# Patient Record
Sex: Male | Born: 1950
Health system: Southern US, Community
[De-identification: ages and names within clinical notes are randomized; demographics above are authoritative.]

## PROBLEM LIST (undated history)

## (undated) DIAGNOSIS — I340 Nonrheumatic mitral (valve) insufficiency: Secondary | ICD-10-CM

## (undated) DIAGNOSIS — I071 Rheumatic tricuspid insufficiency: Secondary | ICD-10-CM

## (undated) DIAGNOSIS — M109 Gout, unspecified: Secondary | ICD-10-CM

## (undated) DIAGNOSIS — Z973 Presence of spectacles and contact lenses: Secondary | ICD-10-CM

## (undated) DIAGNOSIS — E785 Hyperlipidemia, unspecified: Secondary | ICD-10-CM

## (undated) DIAGNOSIS — E119 Type 2 diabetes mellitus without complications: Secondary | ICD-10-CM

## (undated) DIAGNOSIS — M1990 Unspecified osteoarthritis, unspecified site: Secondary | ICD-10-CM

## (undated) DIAGNOSIS — I499 Cardiac arrhythmia, unspecified: Secondary | ICD-10-CM

## (undated) DIAGNOSIS — G4730 Sleep apnea, unspecified: Secondary | ICD-10-CM

## (undated) DIAGNOSIS — T7840XA Allergy, unspecified, initial encounter: Secondary | ICD-10-CM

## (undated) DIAGNOSIS — Z974 Presence of external hearing-aid: Secondary | ICD-10-CM

## (undated) DIAGNOSIS — I2510 Atherosclerotic heart disease of native coronary artery without angina pectoris: Secondary | ICD-10-CM

## (undated) DIAGNOSIS — Z87442 Personal history of urinary calculi: Secondary | ICD-10-CM

## (undated) DIAGNOSIS — I10 Essential (primary) hypertension: Secondary | ICD-10-CM

## (undated) HISTORY — PX: TONSILLECTOMY: SUR1361

## (undated) HISTORY — PX: ADENOIDECTOMY: SUR15

## (undated) HISTORY — PX: SHOULDER SURGERY: SHX246

## (undated) HISTORY — DX: Gout, unspecified: M10.9

## (undated) HISTORY — PX: APPENDECTOMY: SHX54

## (undated) HISTORY — DX: Type 2 diabetes mellitus without complications: E11.9

## (undated) HISTORY — DX: Allergy, unspecified, initial encounter: T78.40XA

## (undated) HISTORY — DX: Essential (primary) hypertension: I10

---

## 2004-01-16 ENCOUNTER — Other Ambulatory Visit: Payer: Self-pay

## 2004-01-22 ENCOUNTER — Ambulatory Visit: Payer: Self-pay | Admitting: Surgery

## 2004-11-20 ENCOUNTER — Ambulatory Visit: Payer: Self-pay | Admitting: Family Medicine

## 2006-02-26 ENCOUNTER — Ambulatory Visit: Payer: Self-pay | Admitting: Family Medicine

## 2006-10-14 ENCOUNTER — Ambulatory Visit: Payer: Self-pay | Admitting: Family Medicine

## 2007-02-21 ENCOUNTER — Ambulatory Visit: Payer: Self-pay | Admitting: Unknown Physician Specialty

## 2007-03-16 ENCOUNTER — Ambulatory Visit: Payer: Self-pay | Admitting: Unknown Physician Specialty

## 2007-04-05 ENCOUNTER — Ambulatory Visit: Payer: Self-pay | Admitting: Unknown Physician Specialty

## 2007-05-02 ENCOUNTER — Ambulatory Visit: Payer: Self-pay | Admitting: Internal Medicine

## 2008-04-10 ENCOUNTER — Ambulatory Visit: Payer: Self-pay | Admitting: Family Medicine

## 2008-05-22 ENCOUNTER — Ambulatory Visit: Payer: Self-pay | Admitting: Family Medicine

## 2009-03-27 ENCOUNTER — Ambulatory Visit: Payer: Self-pay | Admitting: Family Medicine

## 2009-12-05 ENCOUNTER — Ambulatory Visit: Payer: Self-pay | Admitting: Internal Medicine

## 2009-12-12 ENCOUNTER — Ambulatory Visit: Payer: Self-pay | Admitting: Internal Medicine

## 2009-12-18 ENCOUNTER — Ambulatory Visit: Payer: Self-pay | Admitting: Family Medicine

## 2010-01-04 ENCOUNTER — Ambulatory Visit: Payer: Self-pay | Admitting: Internal Medicine

## 2010-03-06 ENCOUNTER — Ambulatory Visit: Payer: Self-pay | Admitting: Internal Medicine

## 2010-04-06 ENCOUNTER — Ambulatory Visit: Payer: Self-pay | Admitting: Internal Medicine

## 2012-01-05 ENCOUNTER — Ambulatory Visit: Payer: Self-pay | Admitting: Orthopedic Surgery

## 2012-12-02 ENCOUNTER — Emergency Department: Payer: Self-pay | Admitting: Emergency Medicine

## 2012-12-02 LAB — CBC
HGB: 16.9 g/dL (ref 13.0–18.0)
MCH: 31.8 pg (ref 26.0–34.0)
MCHC: 35.9 g/dL (ref 32.0–36.0)
Platelet: 138 10*3/uL — ABNORMAL LOW (ref 150–440)
RBC: 5.31 10*6/uL (ref 4.40–5.90)
WBC: 10.1 10*3/uL (ref 3.8–10.6)

## 2012-12-02 LAB — URINALYSIS, COMPLETE
Glucose,UR: NEGATIVE mg/dL (ref 0–75)
Nitrite: NEGATIVE
RBC,UR: 9 /HPF (ref 0–5)
Specific Gravity: 1.02 (ref 1.003–1.030)
Squamous Epithelial: <1

## 2012-12-02 LAB — BASIC METABOLIC PANEL
Calcium, Total: 9.9 mg/dL (ref 8.5–10.1)
Chloride: 101 mmol/L (ref 98–107)
Co2: 29 mmol/L (ref 21–32)
EGFR (African American): >60
EGFR (Non-African Amer.): >60
Osmolality: 278 (ref 275–301)
Potassium: 4.4 mmol/L (ref 3.5–5.1)
Sodium: 137 mmol/L (ref 136–145)

## 2013-09-14 ENCOUNTER — Ambulatory Visit: Payer: Self-pay | Admitting: Orthopedic Surgery

## 2013-09-15 ENCOUNTER — Ambulatory Visit (INDEPENDENT_AMBULATORY_CARE_PROVIDER_SITE_OTHER): Payer: BC Managed Care – PPO | Admitting: Podiatry

## 2013-09-15 ENCOUNTER — Encounter: Payer: Self-pay | Admitting: Podiatry

## 2013-09-15 ENCOUNTER — Ambulatory Visit (INDEPENDENT_AMBULATORY_CARE_PROVIDER_SITE_OTHER): Payer: BC Managed Care – PPO

## 2013-09-15 VITALS — BP 133/79 | HR 67 | Resp 16 | Ht 69.0 in | Wt 230.0 lb

## 2013-09-15 DIAGNOSIS — M722 Plantar fascial fibromatosis: Secondary | ICD-10-CM

## 2013-09-15 DIAGNOSIS — I10 Essential (primary) hypertension: Secondary | ICD-10-CM | POA: Insufficient documentation

## 2013-09-15 DIAGNOSIS — G4730 Sleep apnea, unspecified: Secondary | ICD-10-CM | POA: Insufficient documentation

## 2013-09-15 DIAGNOSIS — E785 Hyperlipidemia, unspecified: Secondary | ICD-10-CM | POA: Insufficient documentation

## 2013-09-15 DIAGNOSIS — E119 Type 2 diabetes mellitus without complications: Secondary | ICD-10-CM | POA: Insufficient documentation

## 2013-09-15 MED ORDER — TRIAMCINOLONE ACETONIDE 10 MG/ML IJ SUSP
10.0000 mg | Freq: Once | INTRAMUSCULAR | Status: AC
  Administered 2013-09-15: 10 mg

## 2013-09-15 NOTE — Progress Notes (Signed)
Subjective:     Patient ID: Phillip Le, male   DOB: Apr 02, 1951, 63 y.o.   MRN: [id number]  HPI patient presents stating the arch of both feet are now hurting and the nodules have returned on both feet with them having disappeared after you gave me the injection last year and just reappeared and the last month. Also states he's taking a steroid pack because of hip issues and that seems to make the feet feel a little bit better   Review of Systems     Objective:   Physical Exam Neurovascular status intact with no health history changes noted and masses in the distal part of the plantar fascia both feet measure about 1 cm x 1 cm and are mildly tender when pressed.    Assessment:     Probable cyst formation as they did go away last time with injection    Plan:     Reviewed x-rays of both feet indicating no calcification and injected the plantar fascia distal of both feet 3 mg Kenalog 5 mm Xylocaine Marcaine mixture and instructed to come in if this does not improve condition or if the masses do not shrink

## 2013-09-15 NOTE — Progress Notes (Signed)
Both feet have a knot below the hallux . Its been about a month and dr Noemi Chapel put me on a prednisone that seems to be helping with the pain

## 2014-02-09 DIAGNOSIS — R002 Palpitations: Secondary | ICD-10-CM | POA: Insufficient documentation

## 2014-07-17 IMAGING — CT CT STONE STUDY
1 of 2 series · 15 of 32 positions shown, 19 images · non-contrast
Comparison: none

REASON FOR EXAM: flank pain
COMMENTS:

PROCEDURE:     CT  - CT ABDOMEN /PELVIS WO (STONE)  - December 02, 2012  [DATE]
RESULT:     CT abdomen pelvis dated 12/02/2012
TECHNIQUE: Helical noncontrasted 3 mm sections were obtained from the lung
bases through the pubic symphysis. Since compared to previous study dated
03/27/2009.

[Series 2: 3mm soft tissue · axial · 0.89mm/px · z∈[+64,+544]mm · 15 of 177 slices shown, 19 images]
[im 9/177  soft-tissue]
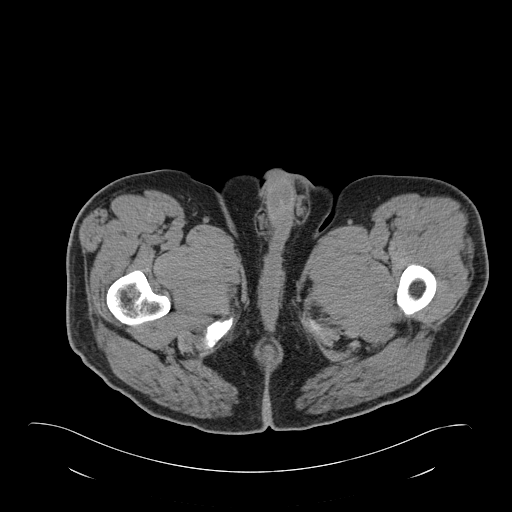
[im 9/177  bone]
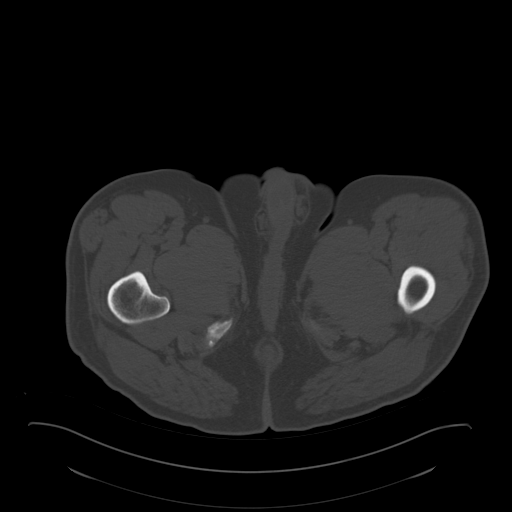
[im 25/177  soft-tissue]
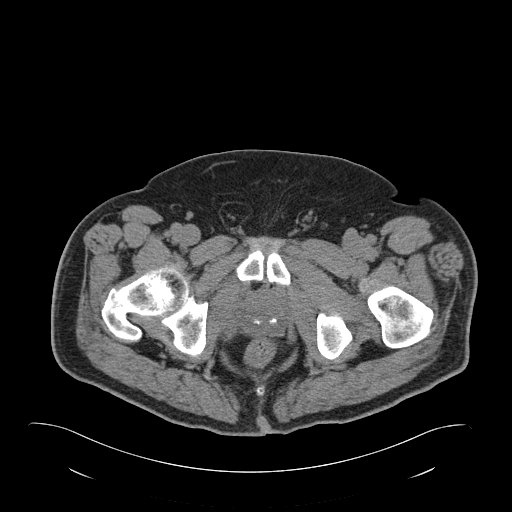
[im 41/177  soft-tissue]
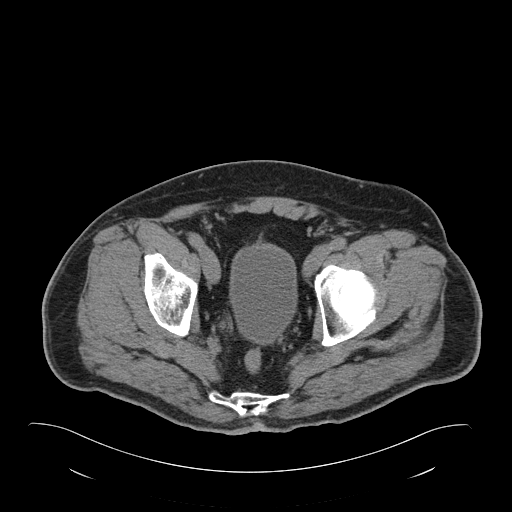
[im 49/177  soft-tissue]
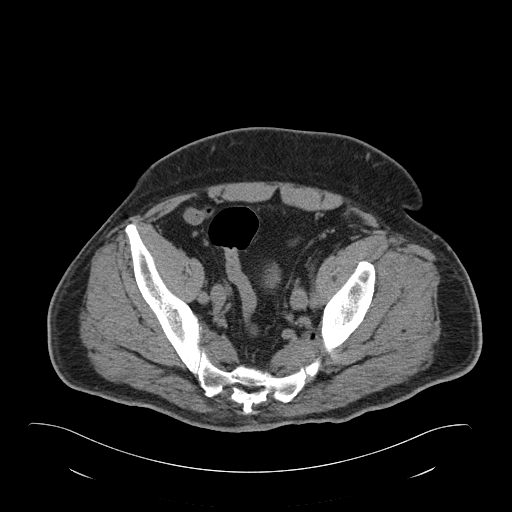
[im 65/177  soft-tissue]
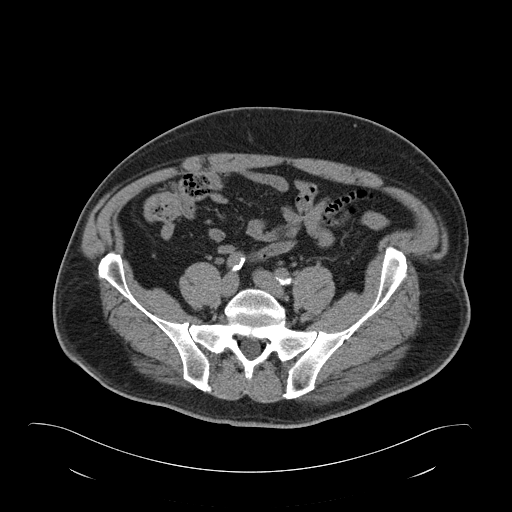
[im 73/177  soft-tissue]
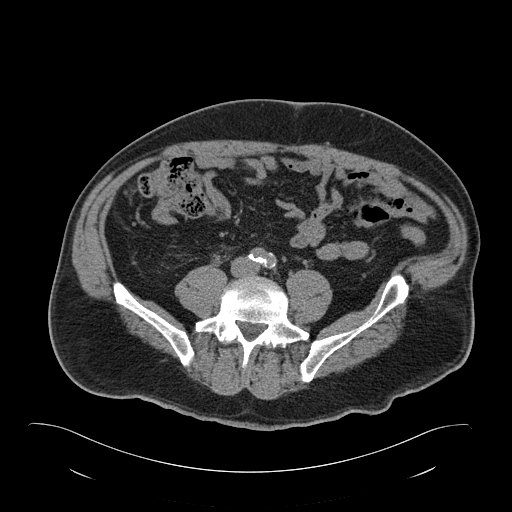
[im 89/177  soft-tissue]
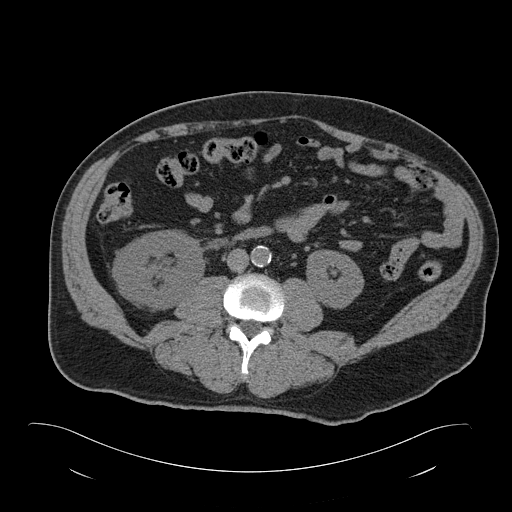
[im 105/177  soft-tissue]
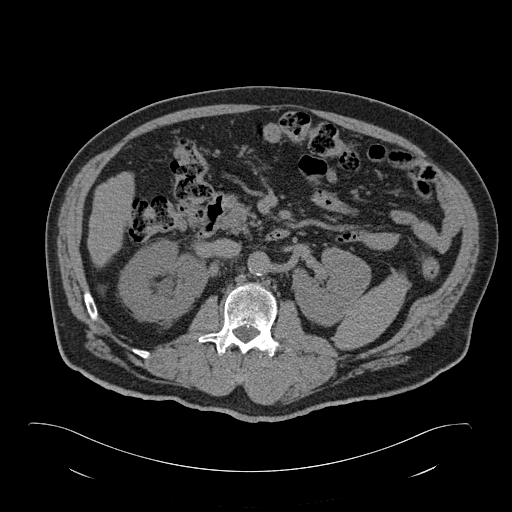
[im 113/177  soft-tissue]
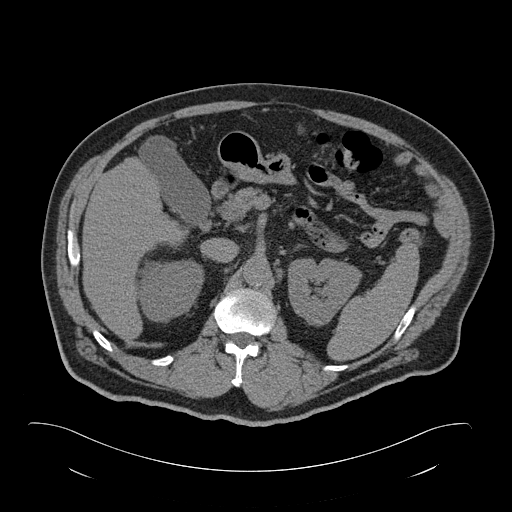
[im 113/177  bone]
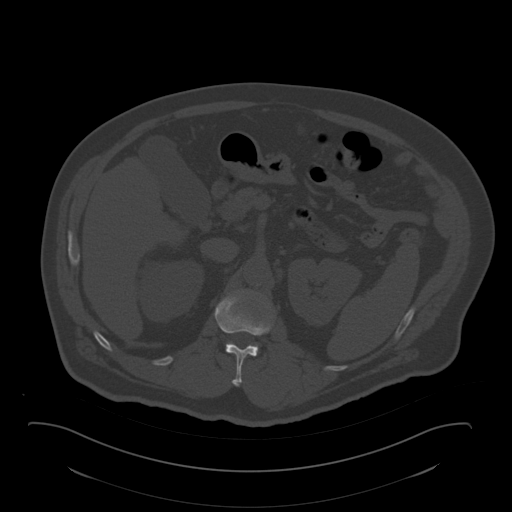
[im 129/177  soft-tissue]
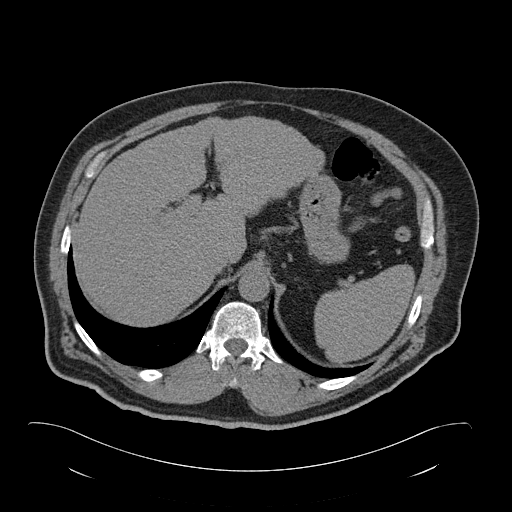
[im 137/177  soft-tissue]
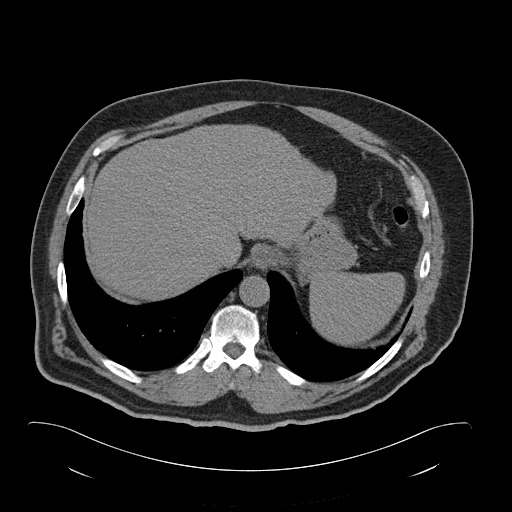
[im 145/177  lung]
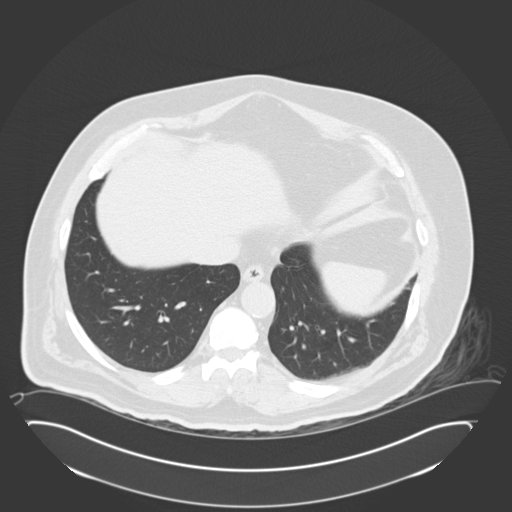
[im 153/177  soft-tissue]
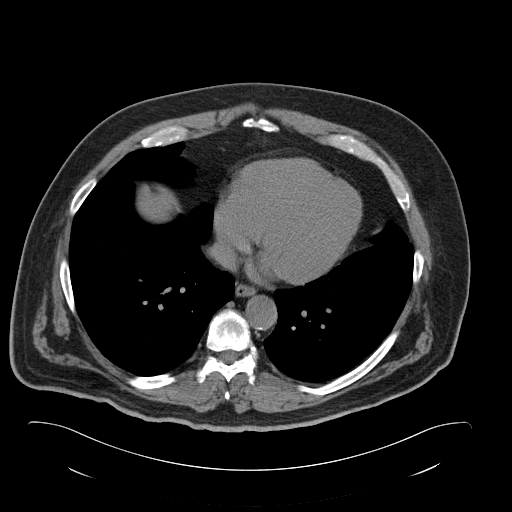
[im 153/177  lung]
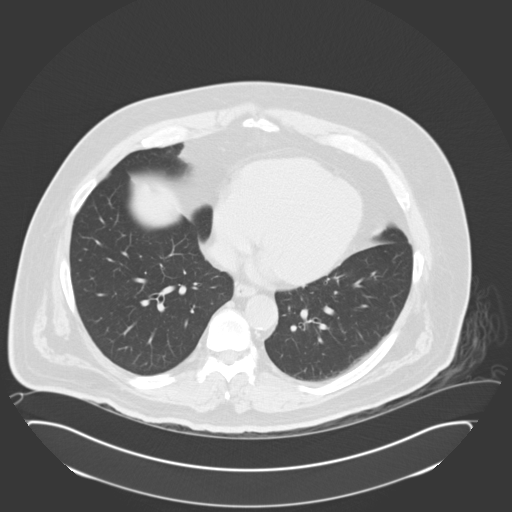
[im 161/177  lung]
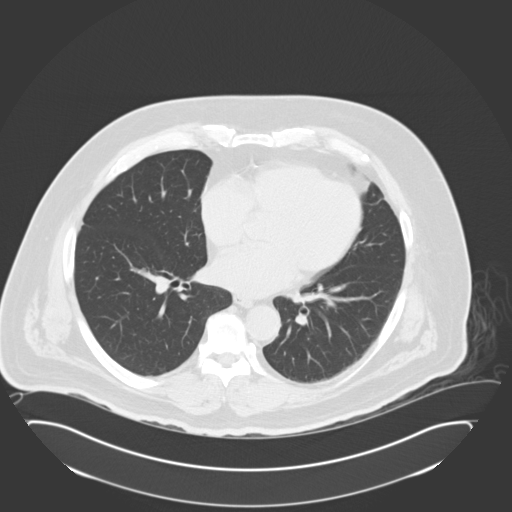
[im 169/177  soft-tissue]
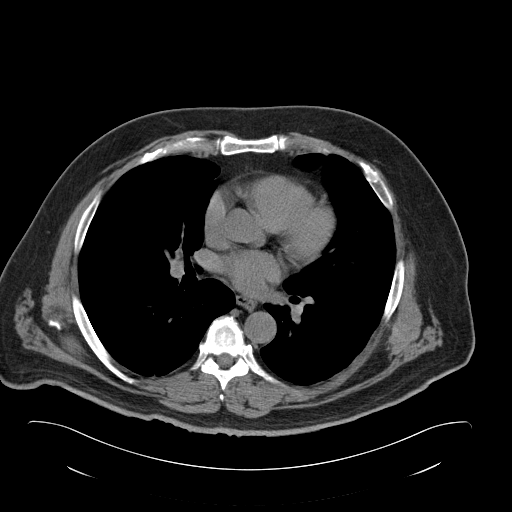
[im 169/177  lung]
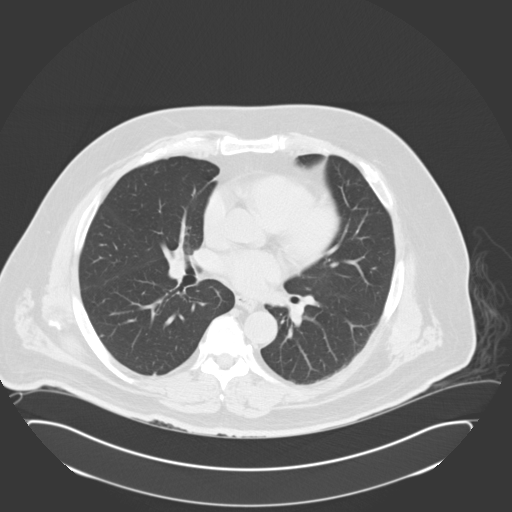

[15 of 32 positions shown; findings below may reference images not displayed]

FINDINGS: The lung bases are unremarkable.

Noncontrast evaluation of the liver, spleen, adrenals, pancreas, left kidney
are unremarkable.

Right kidney demonstrates mild hydronephrosis and mild hydroureter. Mild
stranding within the perinephric and periureteral fat is appreciated. Within
the distal right ureter at the level of the ureteral vesicle junction 3.2 mm
calculus is identified.

There is no evidence of bowel obstruction, enteritis, colitis,
diverticulitis, nor appendicitis. There is no evidence of abdominal aortic
aneurysm.
IMPRESSION: Distal right ureteral calculus projecting in the level of
the ureterovesical junction. There is associated mild obstructive uropathy.

## 2015-04-29 IMAGING — CR RIGHT HIP - 1 VIEW
1 series · 1 of 1 positions shown · non-contrast
Comparison: Frontal view of the pelvis same day

CLINICAL DATA: Chronic pain

EXAM:
RIGHT HIP - 1 VIEW

[lat]
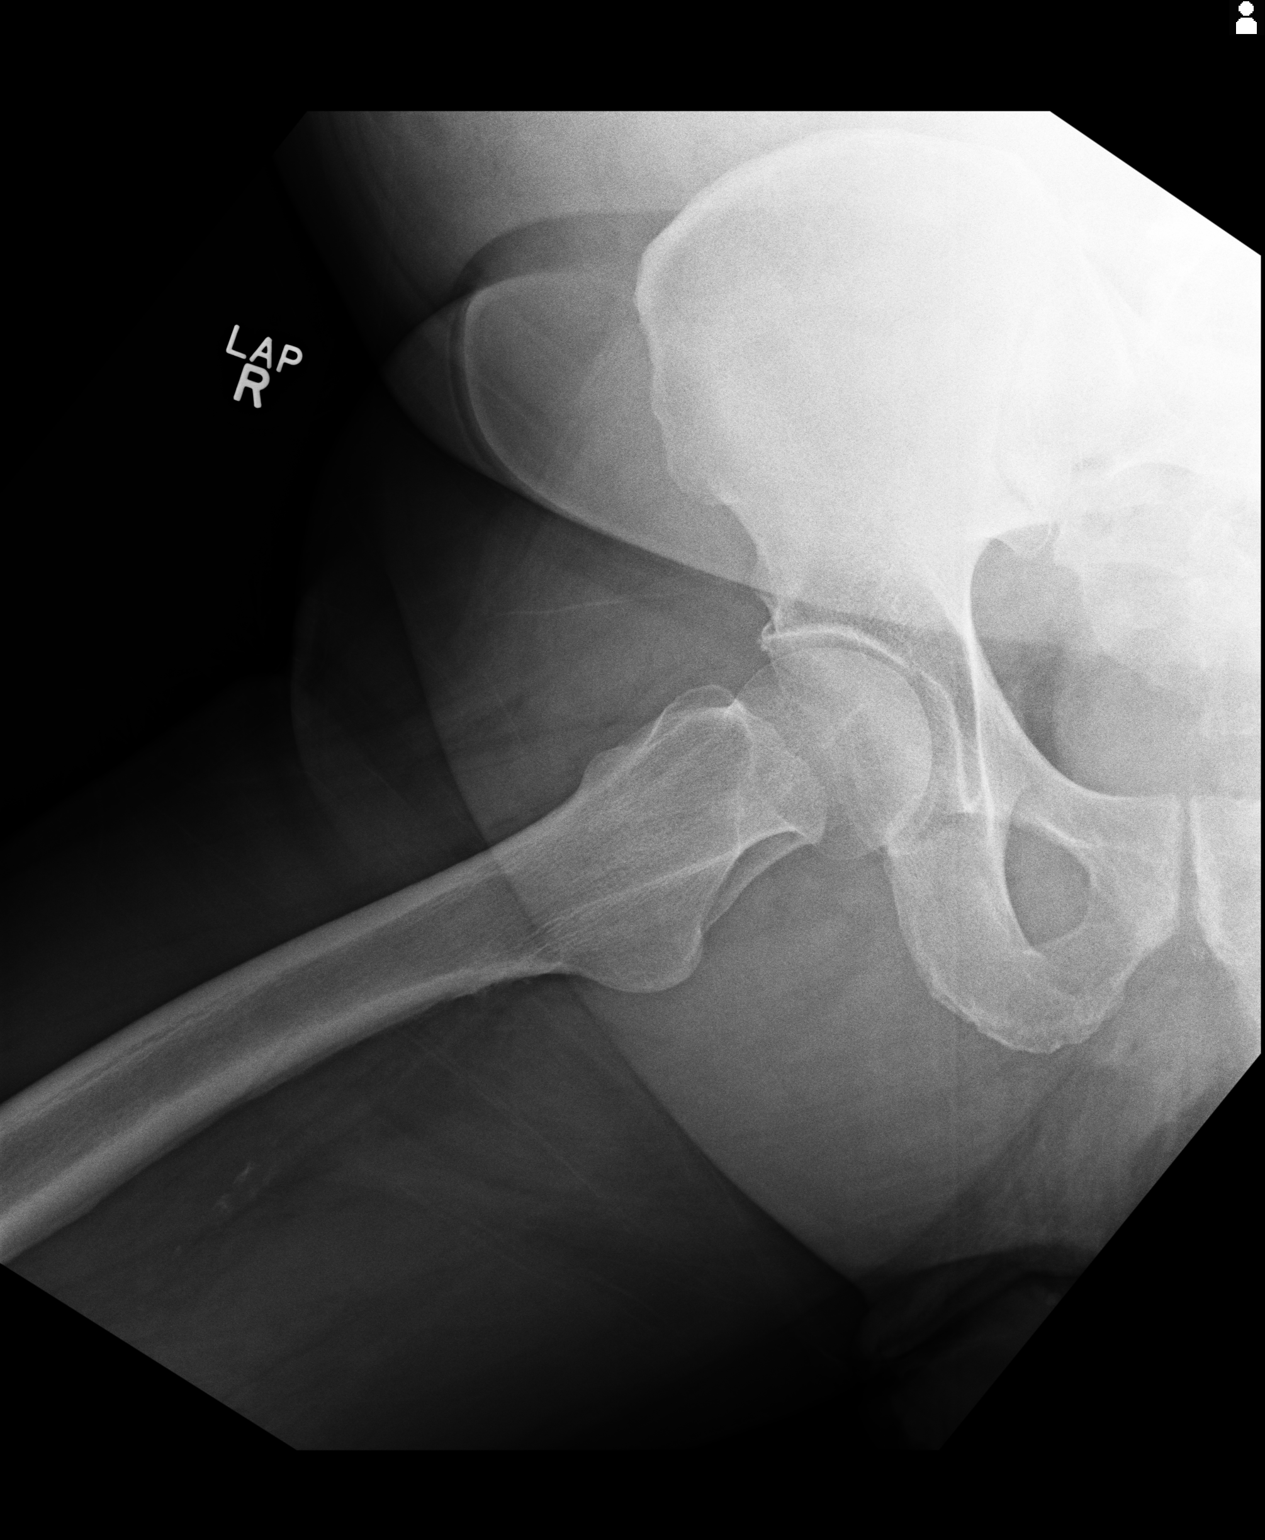

[1 of 1 positions shown; findings below may reference images not displayed]

FINDINGS: Single view of the right hip submitted. No acute fracture or
subluxation. Minimal superior acetabular spurring. Mild degenerative
changes right SI joint.
IMPRESSION: No acute fracture or subluxation. Minimal superior acetabular
spurring.

## 2015-04-29 IMAGING — CR PELVIS - 1-2 VIEW
1 series · 1 of 1 positions shown · non-contrast
Comparison: None.

CLINICAL DATA: Back pain, hip pain

EXAM:
PELVIS - 1-2 VIEW

[ap]
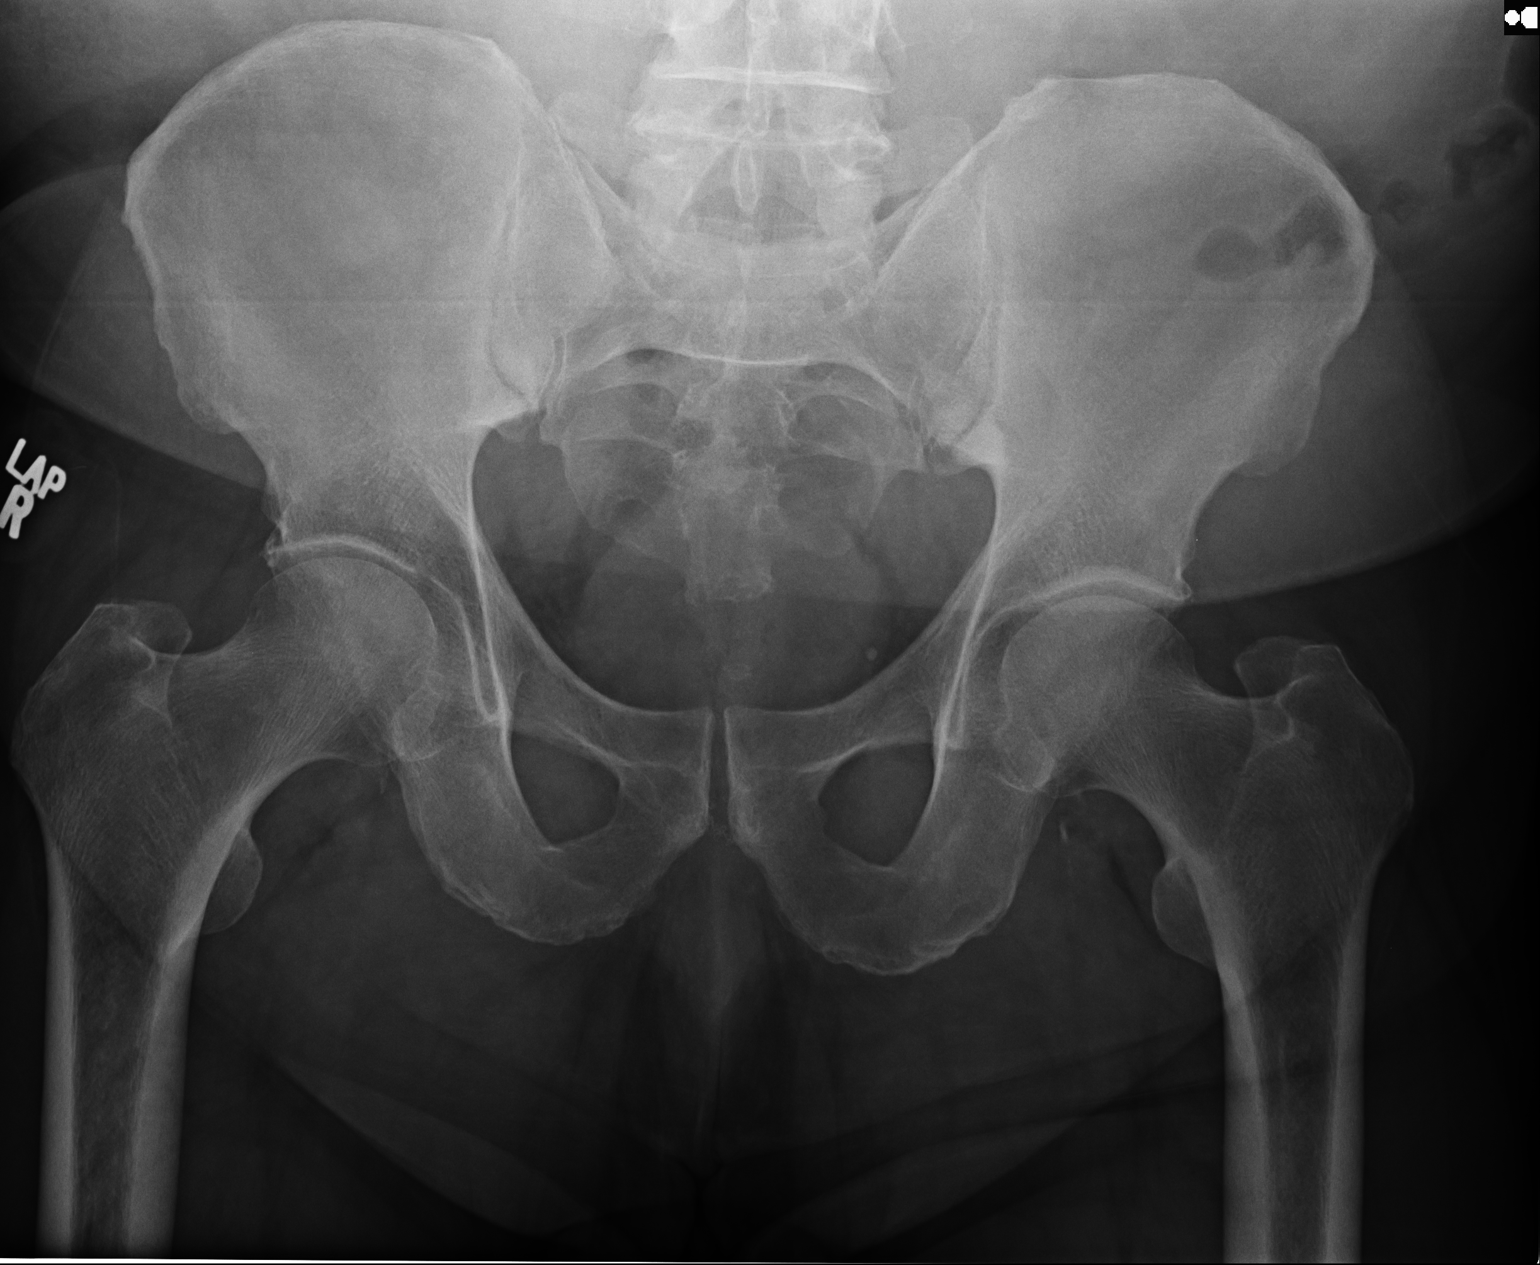

[1 of 1 positions shown; findings below may reference images not displayed]

FINDINGS: Single frontal view of the pelvis submitted. No acute fracture or
subluxation. Minimal superior acetabular spurring bilaterally. Mild
degenerative changes bilateral SI joints.
IMPRESSION: No acute fracture or subluxation. Minimal superior acetabular
spurring bilaterally. Mild degenerative changes SI joints.

## 2015-06-03 ENCOUNTER — Ambulatory Visit
Admission: EM | Admit: 2015-06-03 | Discharge: 2015-06-03 | Disposition: A | Discharge status: Home / Self Care | Payer: BLUE CROSS/BLUE SHIELD | Attending: Emergency Medicine | Admitting: Emergency Medicine

## 2015-06-03 ENCOUNTER — Encounter: Payer: Self-pay | Admitting: Emergency Medicine

## 2015-06-03 ENCOUNTER — Ambulatory Visit (INDEPENDENT_AMBULATORY_CARE_PROVIDER_SITE_OTHER): Payer: BLUE CROSS/BLUE SHIELD

## 2015-06-03 DIAGNOSIS — M10471 Other secondary gout, right ankle and foot: Secondary | ICD-10-CM

## 2015-06-03 MED ORDER — NAPROXEN 500 MG PO TABS: 500.0000 mg | ORAL_TABLET | Freq: Two times a day (BID) | ORAL | Status: DC

## 2015-06-03 NOTE — ED Notes (Signed)
Patient c/o right foot pain for the past 2 days.  Patient states that he fell off a bucket that he was sitting on and twisted his right foot.

## 2015-06-03 NOTE — ED Provider Notes (Signed)
CSN: [id number]     Arrival date & time 06/03/15  0804 History   None    Chief Complaint  Patient presents with  . Foot Pain   (Consider location/radiation/quality/duration/timing/severity/associated sxs/prior Treatment) HPI   65 year old male who presents with right great toe pain at the MTP joint. He states that he was at the beach on Saturday fishing and sitting on a 5 gallon can. He states he felt the sand give way under the bucket whichstarted to tip, he put his right foot back to support himself.  He states that he did not feel an injury to his toe at that time and was able to walk normally on Sunday and Monday but last night his great toe began to hurt when he was sleeping. He does state that he ate a lot of shellfish and has had a case of gout in the past. He is also on a very low carbohydrate diet with increased protein intake. He states he had very little sleep last night because of the pain in his some difficulty walking because of  of the discomfort.  Past Medical History  Diagnosis Date  . Diabetes mellitus without complication ([place])   . Hypertension   . Allergy   . Gout    Past Surgical History  Procedure Laterality Date  . Tonsillectomy    . Adenoidectomy     Family History  Problem Relation Age of Onset  . CAD Mother   . Cancer Father    Social History  Substance Use Topics  . Smoking status: Never Smoker   . Smokeless tobacco: Never Used  . Alcohol Use: No    Review of Systems  Constitutional: Positive for fever, chills and activity change. Negative for fatigue.  Respiratory: Negative for cough.   Musculoskeletal: Positive for arthralgias.  All other systems reviewed and are negative.   Allergies  Review of patient's allergies indicates no known allergies.  Home Medications   Prior to Admission medications   Medication Sig Start Date End Date Taking? Authorizing Provider  pravastatin (PRAVACHOL) 10 MG tablet Take 10 mg by mouth daily.   Yes  Historical Provider, MD  aspirin (ASPIR-LOW) 81 MG EC tablet Take by mouth. 10/25/08   Historical Provider, MD  calcipotriene-betamethasone (TACLONEX) ointment Apply topically. 05/23/09   Historical Provider, MD  Clobetasol Propionate (CLOBEX SPRAY) 0.05 % external spray used once daily 11/28/09   Historical Provider, MD  fluticasone (FLONASE) 50 MCG/ACT nasal spray Place into the nose. 11/28/09   Historical Provider, MD  glucose blood (ACCU-CHEK COMFORT CURVE) test strip  11/12/09   Historical Provider, MD  ketoconazole (NIZORAL) 2 % cream Apply topically. 05/23/09   Historical Provider, MD  Lancets 28G MISC  11/12/09   Historical Provider, MD  levofloxacin (LEVAQUIN) 500 MG tablet  08/08/13   Historical Provider, MD  metFORMIN (GLUCOPHAGE) 500 MG tablet Take by mouth. 11/28/09   Historical Provider, MD  metoprolol (LOPRESSOR) 50 MG tablet Take by mouth. 01/03/10   Historical Provider, MD  naproxen (NAPROSYN) 500 MG tablet Take 1 tablet (500 mg total) by mouth 2 (two) times daily with a meal. 06/03/15   Lorin Picket, PA-C  pravastatin (PRAVACHOL) 10 MG tablet  08/02/13   Historical Provider, MD  predniSONE (STERAPRED UNI-PAK) 10 MG tablet  09/13/13   Historical Provider, MD  ramipril (ALTACE) 10 MG capsule Take by mouth. 10/25/08   Historical Provider, MD  simvastatin (ZOCOR) 40 MG tablet Take by mouth. 11/28/09   Historical Provider, MD  Meds Ordered and Administered this Visit  Medications - No data to display  BP 147/98 mmHg  Pulse 75  Temp(Src) 98 F (36.7 C) (Oral)  Resp 16  Ht 5\' 10"  (1.778 m)  Wt 204 lb (92.534 kg)  BMI 29.27 kg/m2  SpO2 99% No data found.   Physical Exam  Constitutional: He is oriented to person, place, and time. He appears well-developed and well-nourished. No distress.  HENT:  Head: Normocephalic and atraumatic.  Eyes: Conjunctivae are normal. Pupils are equal, round, and reactive to light.  Neck: Normal range of motion. Neck supple.  Musculoskeletal: He exhibits  edema and tenderness.  Neurological: He is alert and oriented to person, place, and time.  Skin: Skin is warm and dry. He is not diaphoretic.  Psychiatric: He has a normal mood and affect. His behavior is normal. Judgment and thought content normal.  Nursing note and vitals reviewed.   ED Course  Procedures (including critical care time)  Labs Review Labs Reviewed - No data to display  Imaging Review Dg Foot Complete Right  06/03/2015  CLINICAL DATA:  Pt was sitting on a bucket in the sand and it shifting with right leg behind him injuring right great toe. Most pain anterolateral IP joint great toe. EXAM: RIGHT FOOT COMPLETE - 3+ VIEW COMPARISON:  Foot films 09/15/2013 FINDINGS: No fracture or dislocation of mid foot or forefoot. The phalanges are normal. The calcaneus is normal. No soft tissue abnormality. IMPRESSION: No fracture or dislocation. Electronically Signed   By: Suzy Bouchard M.D.   On: 06/03/2015 09:16     Visual Acuity Review  Right Eye Distance:   Left Eye Distance:   Bilateral Distance:    Right Eye Near:   Left Eye Near:    Bilateral Near:         MDM   1. Other secondary acute gout of right foot    Discharge Medication List as of 06/03/2015  9:30 AM    START taking these medications   Details  naproxen (NAPROSYN) 500 MG tablet Take 1 tablet (500 mg total) by mouth 2 (two) times daily with a meal., Starting 06/03/2015, Until Discontinued, Normal      Plan: 1. Test/x-ray results and diagnosis reviewed with patient 2. rx as per orders; risks, benefits, potential side effects reviewed with patient 3. Recommend supportive treatment with better dietary choices. I told him that this most likely represents gout especially since he consumed great deal of shellfish over the weekend. He may have also had some mild traumatic arthritis from the injury that he sustained on the beach. Although bacterial infection is unlikely it is possible and he needs to pay  Attention and if  that is not improving in 24-40 hours needs to  return to the clinic for further evaluation. He Cannot remember the treatment that he received 10 years ago for his attack of gout  So I will start him on some Naprosyn at the present time,hopefully  we will see him immediate relief and improvement.. 4. F/u prn if symptoms worsen or don't improve     Lorin Picket, PA-C 06/03/15 [phone]

## 2015-06-03 NOTE — Discharge Instructions (Signed)

## 2015-10-17 ENCOUNTER — Ambulatory Visit
Admission: EM | Admit: 2015-10-17 | Discharge: 2015-10-17 | Disposition: A | Discharge status: Home / Self Care | Payer: BLUE CROSS/BLUE SHIELD | Attending: Emergency Medicine | Admitting: Emergency Medicine

## 2015-10-17 ENCOUNTER — Encounter: Payer: Self-pay | Admitting: *Deleted

## 2015-10-17 DIAGNOSIS — Z4802 Encounter for removal of sutures: Secondary | ICD-10-CM

## 2015-10-17 DIAGNOSIS — Z5189 Encounter for other specified aftercare: Secondary | ICD-10-CM

## 2015-10-17 DIAGNOSIS — S61011A Laceration without foreign body of right thumb without damage to nail, initial encounter: Secondary | ICD-10-CM

## 2015-10-17 MED ORDER — MUPIROCIN 2 % EX OINT: 1.0000 "application " | TOPICAL_OINTMENT | Freq: Three times a day (TID) | CUTANEOUS | Status: DC

## 2015-10-17 NOTE — ED Notes (Signed)
Right thumb with sutures 12 days, placed in [place]. Right thumb appears edematous with dark discoloration.

## 2015-10-17 NOTE — ED Provider Notes (Signed)
HPI  SUBJECTIVE:  Phillip Le is a right handed 65 y.o. male who presents for suture removal. Patient sustained a large laceration to his right thumb 12 days ago, which was sutured at another facility. He states that the wound has been healing, and has no complaints. No aggravating or alleviating factors. Patient has been doing local wound care. He is a diabetic. He is not a smoker.    Past Medical History  Diagnosis Date  . Diabetes mellitus [place] complication ([place])   . Hypertension   . Allergy   . Gout     Past Surgical History  Procedure Laterality Date  . Tonsillectomy    . Adenoidectomy      Family History  Problem Relation Age of Onset  . CAD Mother   . Cancer Father     Social History  Substance Use Topics  . Smoking status: Never Smoker   . Smokeless tobacco: Never Used  . Alcohol Use: No    No current facility-administered medications for this encounter.  Current outpatient prescriptions:  .  aspirin (ASPIR-LOW) 81 MG EC tablet, Take by mouth., Disp: , Rfl:  .  Clobetasol Propionate (CLOBEX SPRAY) 0.05 % external spray, used once daily, Disp: , Rfl:  .  ketoconazole (NIZORAL) 2 % cream, Apply topically., Disp: , Rfl:  .  Lancets 28G MISC, , Disp: , Rfl:  .  metFORMIN (GLUCOPHAGE) 500 MG tablet, Take by mouth., Disp: , Rfl:  .  metoprolol (LOPRESSOR) 50 MG tablet, Take by mouth., Disp: , Rfl:  .  pravastatin (PRAVACHOL) 10 MG tablet, Take 10 mg by mouth daily., Disp: , Rfl:  .  ramipril (ALTACE) 10 MG capsule, Take by mouth., Disp: , Rfl:  .  simvastatin (ZOCOR) 40 MG tablet, Take by mouth., Disp: , Rfl:  .  glucose blood (ACCU-CHEK COMFORT CURVE) test strip, , Disp: , Rfl:  .  mupirocin ointment (BACTROBAN) 2 %, Apply 1 application topically 3 (three) times daily., Disp: 22 g, Rfl: 0 .  naproxen (NAPROSYN) 500 MG tablet, Take 1 tablet (500 mg total) by mouth 2 (two) times daily with a meal., Disp: 60 tablet, Rfl: 0 .  [DISCONTINUED] fluticasone  (FLONASE) 50 MCG/ACT nasal spray, Place into the nose., Disp: , Rfl:   No Known Allergies   ROS  As noted in HPI.   Physical Exam  BP 154/83 mmHg  Pulse 67  Temp(Src) 98.4 F (36.9 C) (Oral)  Resp 16  Ht 5\' 9"  (1.753 m)  Wt 210 lb (95.255 kg)  BMI 31.00 kg/m2  SpO2 96%  Constitutional: Well developed, well nourished, no acute distress Eyes:  EOMI, conjunctiva normal bilaterally HENT: Normocephalic, atraumatic,mucus membranes moist Respiratory: Normal inspiratory effort Cardiovascular: Normal rate GI: nondistended skin: 2.5 cm healing laceration right thumb with 8 sutures in place. Some dehiscence. Necrotic area noted. Decreased sensation over  flap, however, sensation to pressure intact. 2 point discrimination otherwise intact over the entire thumb. No expressible purulent drainage. No pain. See pictures.       Musculoskeletal: no deformities Neurologic: Alert & oriented x 3, no focal neuro deficits Psychiatric: Speech and behavior appropriate   ED Course   Medications - No data to display  No orders of the defined types were placed in this encounter.    No results found for this or any previous visit (from the past 24 hour(s)). No results found.  ED Clinical Impression  Visit for suture removal  Visit for wound check  Laceration of  thumb, right, initial encounter   ED Assessment/Plan  Sutures were removed in their entirety by RN. Pressure dressing was placed. Patient tolerated procedure well.  Patient has a necrotic area, but feel that this will heal well on its own. We will leave the skin as a biologic dressing. Advised patient that it may come off at some point. There is no evidence of infection this time.  Will send home with Bactroban to help prevent any secondary infection as well as continues to heal. Follow-up with PMD as needed. To the ER if gets worse.  Discussed  MDM, plan and followup with patient. Discussed sn/sx that should prompt  return to the ED. Patient agrees with plan.   *This clinic note was created using Dragon dictation software. Therefore, there may be occasional mistakes despite careful proofreading.  ?   Melynda Ripple, MD 10/17/15 1028

## 2015-10-17 NOTE — Discharge Instructions (Signed)
Go to the ED for worsening pain, fevers above 100.4, redness streaking up your thumb, if you start having pus coming from the wound, or other concerns.

## 2016-03-16 ENCOUNTER — Ambulatory Visit
Admission: EM | Admit: 2016-03-16 | Discharge: 2016-03-16 | Disposition: A | Discharge status: Home / Self Care | Payer: BLUE CROSS/BLUE SHIELD | Attending: Family Medicine | Admitting: Family Medicine

## 2016-03-16 DIAGNOSIS — J0110 Acute frontal sinusitis, unspecified: Secondary | ICD-10-CM

## 2016-03-16 DIAGNOSIS — J069 Acute upper respiratory infection, unspecified: Secondary | ICD-10-CM

## 2016-03-16 MED ORDER — ALBUTEROL SULFATE HFA 108 (90 BASE) MCG/ACT IN AERS: 2.0000 | INHALATION_SPRAY | RESPIRATORY_TRACT | 0 refills | Status: DC | PRN

## 2016-03-16 MED ORDER — HYDROCOD POLST-CPM POLST ER 10-8 MG/5ML PO SUER
5.0000 mL | Freq: Every evening | ORAL | 0 refills | Status: DC | PRN
Start: 2016-03-16 — End: 2016-07-01

## 2016-03-16 MED ORDER — BENZONATATE 100 MG PO CAPS: 100.0000 mg | ORAL_CAPSULE | Freq: Three times a day (TID) | ORAL | 0 refills | Status: DC | PRN

## 2016-03-16 MED ORDER — DOXYCYCLINE HYCLATE 100 MG PO CAPS: 100.0000 mg | ORAL_CAPSULE | Freq: Two times a day (BID) | ORAL | 0 refills | Status: DC

## 2016-03-16 NOTE — Discharge Instructions (Signed)
Take medication as prescribed. Rest. Drink plenty of fluids.  ° °Follow up with your primary care physician this week as needed. Return to Urgent care for new or worsening concerns.  ° °

## 2016-03-16 NOTE — ED Provider Notes (Signed)
[facility] ____________________________________________  Time seen: Approximately 8:42 AM  I have reviewed the triage vital signs and the nursing notes.   HISTORY  Chief Complaint Nasal Congestion   HPI Phillip Le is a 65 y.o. male present for the complaints of 5-6 days of runny nose, nasal congestion, sinus pressure and cough. Patient reports cough is a gradual onset. Patient states that cough initially was dry but now occasionally productive with associated postnasal drainage. Patient reports frequently blowing his nose and getting very thick greenish nasal drainage. States sore throat from coughing.  Patient reports occasionally hearing himself wheeze with active cough. Denies other wheezing. Denies chest pain or shortness of breath. Reports symptoms aren't resolved with over-the-counter Alka-Seltzer cough cold medicine. Reports continues to eat and drink well.   Denies chest pain, shortness of breath, fevers, known sick contacts, recent sickness, recent antibiotic use, weakness, dizziness, extremity pain or extremity swelling. Denies any recent medication changes.   PCP: Ellison Hughs  Past Medical History:  Diagnosis Date  . Allergy   . Diabetes mellitus without complication ([place])   . Gout   . Hypertension     Patient Active Problem List   Diagnosis Date Noted  . Diabetes mellitus, type 2 ([place]) 09/15/2013  . BP (high blood pressure) 09/15/2013  . HLD (hyperlipidemia) 09/15/2013  . Apnea, sleep 09/15/2013    Past Surgical History:  Procedure Laterality Date  . ADENOIDECTOMY    . TONSILLECTOMY      No current facility-administered medications for this encounter.   Current Outpatient Prescriptions:  .  aspirin (ASPIR-LOW) 81 MG EC tablet, Take by mouth., Disp: , Rfl:  .  glucose blood (ACCU-CHEK COMFORT CURVE) test strip, , Disp: , Rfl:  .  Lancets 28G MISC, , Disp: , Rfl:  .  metoprolol (LOPRESSOR) 50 MG tablet, Take by mouth., Disp: , Rfl:  .   pravastatin (PRAVACHOL) 10 MG tablet, Take 10 mg by mouth daily., Disp: , Rfl:  .  ramipril (ALTACE) 10 MG capsule, Take by mouth., Disp: , Rfl:  .  albuterol (PROVENTIL HFA;VENTOLIN HFA) 108 (90 Base) MCG/ACT inhaler, Inhale 2 puffs into the lungs every 4 (four) hours as needed., Disp: 1 Inhaler, Rfl: 0 .  benzonatate (TESSALON PERLES) 100 MG capsule, Take 1 capsule (100 mg total) by mouth 3 (three) times daily as needed., Disp: 15 capsule, Rfl: 0 .  chlorpheniramine-HYDROcodone (TUSSIONEX PENNKINETIC ER) 10-8 MG/5ML SUER, Take 5 mLs by mouth at bedtime as needed. do not drive or operate machinery while taking as can cause drowsiness., Disp: 75 mL, Rfl: 0 .  doxycycline (VIBRAMYCIN) 100 MG capsule, Take 1 capsule (100 mg total) by mouth 2 (two) times daily., Disp: 20 capsule, Rfl: 0  Allergies Patient has no known allergies.  Family History  Problem Relation Age of Onset  . CAD Mother   . Cancer Father     Social History Social History  Substance Use Topics  . Smoking status: Never Smoker  . Smokeless tobacco: Never Used  . Alcohol use No    Review of Systems Constitutional: No fever/chills Eyes: No visual changes. ENT: As above.  Cardiovascular: Denies chest pain. Respiratory: Denies shortness of breath. Gastrointestinal: No abdominal pain.  No nausea, no vomiting.  No diarrhea.  No constipation. Genitourinary: Negative for dysuria. Musculoskeletal: Negative for back pain. Skin: Negative for rash. Neurological: Negative for headaches, focal weakness or numbness.  10-point ROS otherwise negative.  ____________________________________________   PHYSICAL EXAM:  VITAL SIGNS: ED Triage Vitals  Enc Vitals Group     BP 03/16/16 0827 129/79     Pulse Rate 03/16/16 0827 89     Resp 03/16/16 0827 18     Temp 03/16/16 0827 98.9 F (37.2 C)     Temp Source 03/16/16 0827 Oral     SpO2 03/16/16 0827 96 %     Weight 03/16/16 0827 215 lb (97.5 kg)     Height 03/16/16 0827 5'  9" (1.753 m)     Head Circumference --      Peak Flow --      Pain Score 03/16/16 0828 3     Pain Loc --      Pain Edu? --      Excl. in [place]? --    Constitutional: Alert and oriented. Well appearing and in no acute distress. Eyes: Conjunctivae are normal. PERRL. EOMI. Head: Atraumatic.Mild  tenderness to palpation bilateral frontal and nontender maxillary sinuses. No swelling. No erythema.   Ears: no erythema, normal TMs bilaterally.   Nose: nasal congestion with bilateral nasal turbinate erythema and edema.   Mouth/Throat: Mucous membranes are moist.  Oropharynx non-erythematous.No exudate.  Neck: No stridor.  No cervical spine tenderness to palpation. Hematological/Lymphatic/Immunilogical: No cervical lymphadenopathy. Cardiovascular: Normal rate, regular rhythm. Grossly normal heart sounds. Good peripheral circulation. Respiratory: Normal respiratory effort.  No retractions. Lungs CTAB. No wheezes, rales or rhonchi. Good air movement. Dry intermittent cough noted in room with mild bronchospasm wheeze noted with cough. No focal area of consolidation auscultated. Gastrointestinal: Soft and nontender. No distention. No CVA tenderness. Musculoskeletal: Ambulatory with steady gait. No cervical, thoracic or lumbar tenderness to palpation.  Neurologic:  Normal speech and language. No gross focal neurologic deficits are appreciated. No gait instability. Skin:  Skin is warm, dry and intact. No rash noted. Psychiatric: Mood and affect are normal. Speech and behavior are normal.  ___________________________________________   LABS (all labs ordered are listed, but only abnormal results are displayed)  Labs Reviewed - No data to display  RADIOLOGY  No results found.  PROCEDURES Procedures    INITIAL IMPRESSION / ASSESSMENT AND PLAN / ED COURSE  Pertinent labs & imaging results that were available during my care of the patient were reviewed by me and considered in my medical decision  making (see chart for details).   Well-appearing patient. No acute distress. Suspect sinusitis and upper respiratory infection. Patient does have some sinus tenderness as well as nasal congestion, with intermittent cough noted in room with occasional bronchospasm. Otherwise lungs clear throughout. Suspect viral sinusitis and upper respiratory infection. Discussed with patient in detail treating supportively. However discussed patient with patient if symptoms still persist after 2 days of supportive treatment and initiate oral doxycycline antibiotic which prescription was given. Will treat patient with oral Tessalon Perles during the day as needed and when necessary Tussionex at night, as well as when necessary albuterol inhaler.Discussed indication, risks and benefits of medications with patient.  Discussed follow up with Primary care physician this week. Discussed follow up and return parameters including no resolution or any worsening concerns. Patient verbalized understanding and agreed to plan.   ____________________________________________   FINAL CLINICAL IMPRESSION(S) / ED DIAGNOSES  Final diagnoses:  Acute frontal sinusitis, recurrence not specified  Upper respiratory tract infection, unspecified type     Discharge Medication List as of 03/16/2016  8:51 AM    START taking these medications   Details  albuterol (PROVENTIL HFA;VENTOLIN HFA) 108 (90 Base) MCG/ACT inhaler Inhale 2 puffs into the  lungs every 4 (four) hours as needed., Starting Mon 03/16/2016, Normal    benzonatate (TESSALON PERLES) 100 MG capsule Take 1 capsule (100 mg total) by mouth 3 (three) times daily as needed., Starting Mon 03/16/2016, Normal    chlorpheniramine-HYDROcodone (TUSSIONEX PENNKINETIC ER) 10-8 MG/5ML SUER Take 5 mLs by mouth at bedtime as needed. do not drive or operate machinery while taking as can cause drowsiness., Starting Mon 03/16/2016, Print    doxycycline (VIBRAMYCIN) 100 MG capsule Take 1  capsule (100 mg total) by mouth 2 (two) times daily., Starting Mon 03/16/2016, Normal        Note: This dictation was prepared with Dragon dictation along with smaller phrase technology. Any transcriptional errors that result from this process are unintentional.    Clinical Course       Marylene Land, NP 03/16/16 [phone]

## 2016-03-16 NOTE — ED Triage Notes (Signed)
Pt c/o nasal congestion for about a week, productive coughing and ear pains. He has tried OTC with no relief.

## 2016-07-01 ENCOUNTER — Encounter: Payer: Self-pay | Admitting: *Deleted

## 2016-07-01 ENCOUNTER — Ambulatory Visit (INDEPENDENT_AMBULATORY_CARE_PROVIDER_SITE_OTHER): Payer: BLUE CROSS/BLUE SHIELD

## 2016-07-01 ENCOUNTER — Ambulatory Visit
Admission: EM | Admit: 2016-07-01 | Discharge: 2016-07-01 | Disposition: A | Discharge status: Home / Self Care | Payer: BLUE CROSS/BLUE SHIELD | Attending: Internal Medicine | Admitting: Internal Medicine

## 2016-07-01 DIAGNOSIS — R05 Cough: Secondary | ICD-10-CM | POA: Diagnosis not present

## 2016-07-01 DIAGNOSIS — R059 Cough, unspecified: Secondary | ICD-10-CM

## 2016-07-01 DIAGNOSIS — J0100 Acute maxillary sinusitis, unspecified: Secondary | ICD-10-CM

## 2016-07-01 MED ORDER — BENZONATATE 100 MG PO CAPS: 100.0000 mg | ORAL_CAPSULE | Freq: Three times a day (TID) | ORAL | 0 refills | Status: DC | PRN

## 2016-07-01 MED ORDER — HYDROCOD POLST-CPM POLST ER 10-8 MG/5ML PO SUER: 5.0000 mL | Freq: Every evening | ORAL | 0 refills | Status: DC | PRN

## 2016-07-01 MED ORDER — DOXYCYCLINE HYCLATE 100 MG PO CAPS: 100.0000 mg | ORAL_CAPSULE | Freq: Two times a day (BID) | ORAL | 0 refills | Status: DC

## 2016-07-01 MED ORDER — PREDNISONE 20 MG PO TABS: 40.0000 mg | ORAL_TABLET | Freq: Every day | ORAL | 0 refills | Status: DC

## 2016-07-01 MED ORDER — ALBUTEROL SULFATE HFA 108 (90 BASE) MCG/ACT IN AERS: 2.0000 | INHALATION_SPRAY | Freq: Four times a day (QID) | RESPIRATORY_TRACT | 0 refills | Status: DC | PRN

## 2016-07-01 NOTE — Discharge Instructions (Signed)
Take medication as prescribed. Rest. Drink plenty of fluids.  ° °Follow up with your primary care physician this week as needed. Return to Urgent care for new or worsening concerns.  ° °

## 2016-07-01 NOTE — ED Provider Notes (Signed)
[facility] ____________________________________________  Time seen: Approximately 1:30 PM  I have reviewed the triage vital signs and the nursing notes.   HISTORY  Chief Complaint Nasal Congestion; Cough; and Shortness of Breath  HPI Phillip Le is a 66 y.o. male presents for evaluation of 1.5 weeks of runny nose, nasal congestion, sinus pressure, sinus drainage, cough and chest congestion. Denies known fevers. Reports overall continues to eat and drink well. Reports frequently having thick nasal drainage and blowing his nose as well as thick mucus with a productive cough. States occasional wheeze at night. Reports has continued to remain active. Denies sick contacts. Reports and have been unresolved with over-the-counter cough and congestion agents. Denies seasonal allergies. Denies sore throat. States cough intermittently wakes him up at night. Denies any pain at this time.  Denies chest pain, chest pain with deep breath, shortness of breath, abdominal pain, dysuria, extremity pain, extremity swelling or rash.  Denies recent antibiotic use.     Past Medical History:  Diagnosis Date  . Allergy   . Diabetes mellitus without complication ([place])   . Gout   . Hypertension     Patient Active Problem List   Diagnosis Date Noted  . Diabetes mellitus, type 2 ([place]) 09/15/2013  . BP (high blood pressure) 09/15/2013  . HLD (hyperlipidemia) 09/15/2013  . Apnea, sleep 09/15/2013    Past Surgical History:  Procedure Laterality Date  . ADENOIDECTOMY    . TONSILLECTOMY       No current facility-administered medications for this encounter.   Current Outpatient Prescriptions:  .  aspirin (ASPIR-LOW) 81 MG EC tablet, Take by mouth., Disp: , Rfl:  .  glucose blood (ACCU-CHEK COMFORT CURVE) test strip, , Disp: , Rfl:  .  Lancets 28G MISC, , Disp: , Rfl:  .  metFORMIN (GLUCOPHAGE) 500 MG tablet, Take 500 mg by mouth 2 (two) times daily with a meal., Disp: , Rfl:  .   metoprolol (LOPRESSOR) 50 MG tablet, Take by mouth., Disp: , Rfl:  .  pravastatin (PRAVACHOL) 10 MG tablet, Take 10 mg by mouth daily., Disp: , Rfl:  .  ramipril (ALTACE) 10 MG capsule, Take by mouth., Disp: , Rfl:  .  albuterol (PROVENTIL HFA;VENTOLIN HFA) 108 (90 Base) MCG/ACT inhaler, Inhale 2 puffs into the lungs every 6 (six) hours as needed for wheezing., Disp: 1 Inhaler, Rfl: 0 .  benzonatate (TESSALON PERLES) 100 MG capsule, Take 1 capsule (100 mg total) by mouth 3 (three) times daily as needed for cough., Disp: 15 capsule, Rfl: 0 .  chlorpheniramine-HYDROcodone (TUSSIONEX PENNKINETIC ER) 10-8 MG/5ML SUER, Take 5 mLs by mouth at bedtime as needed for cough. do not drive or operate machinery while taking as can cause drowsiness., Disp: 75 mL, Rfl: 0 .  doxycycline (VIBRAMYCIN) 100 MG capsule, Take 1 capsule (100 mg total) by mouth 2 (two) times daily., Disp: 20 capsule, Rfl: 0 .  predniSONE (DELTASONE) 20 MG tablet, Take 2 tablets (40 mg total) by mouth daily., Disp: 10 tablet, Rfl: 0  Allergies Patient has no known allergies.  Family History  Problem Relation Age of Onset  . CAD Mother   . Cancer Father     Social History Social History  Substance Use Topics  . Smoking status: Never Smoker  . Smokeless tobacco: Never Used  . Alcohol use No    Review of Systems Constitutional: No fever/chills Eyes: No visual changes. ENT: As above.  Cardiovascular: Denies chest pain. Respiratory: Denies shortness of breath. Gastrointestinal: No  abdominal pain.  No nausea, no vomiting.  No diarrhea.  No constipation. Genitourinary: Negative for dysuria. Musculoskeletal: Negative for back pain. Skin: Negative for rash. Neurological: Negative for focal weakness or numbness.   ____________________________________________   PHYSICAL EXAM:  VITAL SIGNS: ED Triage Vitals  Enc Vitals Group     BP 07/01/16 1317 131/80     Pulse Rate 07/01/16 1317 63     Resp 07/01/16 1317 15     Temp  07/01/16 1317 98.3 F (36.8 C)     Temp Source 07/01/16 1317 Oral     SpO2 07/01/16 1317 97 %     Weight 07/01/16 1318 205 lb (93 kg)     Height 07/01/16 1318 5\' 9"  (1.753 m)     Head Circumference --      Peak Flow --      Pain Score 07/01/16 1320 0     Pain Loc --      Pain Edu? --      Excl. in [place]? --    Constitutional: Alert and oriented. Well appearing and in no acute distress. Eyes: Conjunctivae are normal. PERRL. EOMI. Head: Atraumatic.No tenderness to palpation bilateral frontal and Mild to moderate Tenderness to palpation maxillary sinuses. No swelling. No erythema.   Ears: no erythema, normal TMs bilaterally.   Nose: nasal congestion with bilateral nasal turbinate erythema and edema.   Mouth/Throat: Mucous membranes are moist.  Oropharynx non-erythematous.No tonsillar swelling or exudate.  Neck: No stridor.  No cervical spine tenderness to palpation. Hematological/Lymphatic/Immunilogical: No cervical lymphadenopathy. Cardiovascular: Normal rate, regular rhythm. Grossly normal heart sounds.  Good peripheral circulation. Respiratory: Normal respiratory effort.  No retractions.  Good air movement. Speaks in complete sentences. Bilateral base rhonchi. Occasional dry cough noted in room with bronchospasm. Gastrointestinal: Soft and nontender.  Musculoskeletal: No cervical, thoracic or lumbar tenderness to palpation. Steady gait. Neurologic:  Normal speech and language. No gait instability. Skin:  Skin is warm, dry and intact. No rash noted. Psychiatric: Mood and affect are normal. Speech and behavior are normal.  ___________________________________________   LABS (all labs ordered are listed, but only abnormal results are displayed)  Labs Reviewed - No data to display ____________________________________________  RADIOLOGY  Dg Chest 2 View  Result Date: 07/01/2016 CLINICAL DATA:  Cough.  [place] EXAM: CHEST  2 VIEW COMPARISON:  None. FINDINGS: The heart size and  mediastinal contours are within normal limits. Both lungs are clear. The visualized skeletal structures are unremarkable. IMPRESSION: No active cardiopulmonary disease. Electronically Signed   By: Franchot Gallo M.D.   On: 07/01/2016 14:17   ____________________________________________   PROCEDURES Procedures    INITIAL IMPRESSION / ASSESSMENT AND PLAN / ED COURSE  Pertinent labs & imaging results that were available during my care of the patient were reviewed by me and considered in my medical decision making (see chart for details).  Well-appearing patient. No acute distress. Chest x-ray per radiologist no active cardiopulmonary disease. Will treat patient with oral Tessalon Perles, prn Tussionex, prednisone, albuterol inhaler, prednisone and doxycycline. Encouraged rest, fluids and supportive care.Discussed indication, risks and benefits of medications with patient. Discussed with patient monitoring blood sugar with prednisone as well as skin sensitivity with doxycycline.  Discussed follow up with Primary care physician this week. Discussed follow up and return parameters including no resolution or any worsening concerns. Patient verbalized understanding and agreed to plan.   ____________________________________________   FINAL CLINICAL IMPRESSION(S) / ED DIAGNOSES  Final diagnoses:  Acute maxillary sinusitis, recurrence not specified  Cough     Discharge Medication List as of 07/01/2016  2:29 PM    START taking these medications   Details  predniSONE (DELTASONE) 20 MG tablet Take 2 tablets (40 mg total) by mouth daily., Starting Wed 07/01/2016, Normal        Note: This dictation was prepared with Dragon dictation along with smaller phrase technology. Any transcriptional errors that result from this process are unintentional.         Marylene Land, NP 07/01/16 1543

## 2016-07-01 NOTE — ED Triage Notes (Signed)
Patient started having symptoms of nasal congestion-drainage, productive cough, and chest congestion 1 week ago. No sore throat or ear pain reported.

## 2016-11-01 ENCOUNTER — Ambulatory Visit
Admission: EM | Admit: 2016-11-01 | Discharge: 2016-11-01 | Disposition: A | Discharge status: Home / Self Care | Payer: BLUE CROSS/BLUE SHIELD | Attending: Family Medicine | Admitting: Family Medicine

## 2016-11-01 ENCOUNTER — Encounter: Payer: Self-pay | Admitting: Gynecology

## 2016-11-01 DIAGNOSIS — Z23 Encounter for immunization: Secondary | ICD-10-CM

## 2016-11-01 DIAGNOSIS — S91331A Puncture wound without foreign body, right foot, initial encounter: Secondary | ICD-10-CM | POA: Diagnosis not present

## 2016-11-01 DIAGNOSIS — W458XXA Other foreign body or object entering through skin, initial encounter: Secondary | ICD-10-CM | POA: Diagnosis not present

## 2016-11-01 DIAGNOSIS — T148XXA Other injury of unspecified body region, initial encounter: Secondary | ICD-10-CM

## 2016-11-01 DIAGNOSIS — E119 Type 2 diabetes mellitus without complications: Secondary | ICD-10-CM | POA: Diagnosis not present

## 2016-11-01 MED ORDER — LEVOFLOXACIN 500 MG PO TABS: 500.0000 mg | ORAL_TABLET | Freq: Every day | ORAL | 0 refills | Status: DC

## 2016-11-01 MED ORDER — TETANUS-DIPHTH-ACELL PERTUSSIS 5-2.5-18.5 LF-MCG/0.5 IM SUSP
0.5000 mL | Freq: Once | INTRAMUSCULAR | Status: AC
  Administered 2016-11-01: 0.5 mL via INTRAMUSCULAR

## 2016-11-01 NOTE — ED Triage Notes (Signed)
Patient c/o step on a screw x yesterday on his right foot. Per patient painful to walk on.

## 2016-11-01 NOTE — ED Provider Notes (Signed)
[facility]    CSN: [id number] Arrival date & time: 11/01/16  7673  History   Chief Complaint Chief Complaint  Patient presents with  . Foot Pain   HPI 66 year old male with DM-2, HTN, HLD presents with complaints of a puncture wound.  Patient reports that yesterday he was working in his barn and subsequently stepped on a screw. It punctured his shoe. He states that it was enlarged in his right foot, at the heel. He could not remove it manually so he used a drill to do so. He presents today for evaluation. No reports of drainage from the wound currently. No erythema. He states that he feels like he had chills last night. No fever. He is unsure of his last tetanus. Given injury and his medical history, he is concerned about infection. No other associated symptoms. He has no other complaints or concerns at this time.  Past Medical History:  Diagnosis Date  . Allergy   . Diabetes mellitus without complication ([place])   . Gout   . Hypertension    Patient Active Problem List   Diagnosis Date Noted  . Diabetes mellitus, type 2 ([place]) 09/15/2013  . BP (high blood pressure) 09/15/2013  . HLD (hyperlipidemia) 09/15/2013  . Apnea, sleep 09/15/2013   Past Surgical History:  Procedure Laterality Date  . ADENOIDECTOMY    . APPENDECTOMY    . TONSILLECTOMY      Home Medications    Prior to Admission medications   Medication Sig Start Date End Date Taking? Authorizing Provider  albuterol (PROVENTIL HFA;VENTOLIN HFA) 108 (90 Base) MCG/ACT inhaler Inhale 2 puffs into the lungs every 6 (six) hours as needed for wheezing. 07/01/16  Yes Marylene Land, NP  aspirin (ASPIR-LOW) 81 MG EC tablet Take by mouth. 10/25/08  Yes [provider]  benzonatate (TESSALON PERLES) 100 MG capsule Take 1 capsule (100 mg total) by mouth 3 (three) times daily as needed for cough. 07/01/16  Yes Marylene Land, NP  glucose blood (ACCU-CHEK COMFORT CURVE) test strip  11/12/09  Yes [provider]  metFORMIN (GLUCOPHAGE) 500 MG tablet Take 500 mg by mouth 2 (two) times daily with a meal.   Yes [provider]  metoprolol (LOPRESSOR) 50 MG tablet Take by mouth. 01/03/10  Yes [provider]  pravastatin (PRAVACHOL) 10 MG tablet Take 10 mg by mouth daily.   Yes [provider]  ramipril (ALTACE) 10 MG capsule Take by mouth. 10/25/08  Yes [provider]  chlorpheniramine-HYDROcodone (TUSSIONEX PENNKINETIC ER) 10-8 MG/5ML SUER Take 5 mLs by mouth at bedtime as needed for cough. do not drive or operate machinery while taking as can cause drowsiness. 07/01/16   Marylene Land, NP  doxycycline (VIBRAMYCIN) 100 MG capsule Take 1 capsule (100 mg total) by mouth 2 (two) times daily. 07/01/16   Marylene Land, NP  Lancets 28G [place]  11/12/09   [provider]  levofloxacin (LEVAQUIN) 500 MG tablet Take 1 tablet (500 mg total) by mouth daily. 11/01/16   Coral Spikes, DO  predniSONE (DELTASONE) 20 MG tablet Take 2 tablets (40 mg total) by mouth daily. 07/01/16   Marylene Land, NP    Family History Family History  Problem Relation Age of Onset  . CAD Mother   . Cancer Father     Social History Social History  Substance Use Topics  . Smoking status: Never Smoker  . Smokeless tobacco: Never Used  . Alcohol use No  Allergies   Patient has no known allergies.   Review of Systems Review of Systems  Constitutional: Positive for chills.  Skin: Positive for wound.   Physical Exam Triage Vital Signs ED Triage Vitals  Enc Vitals Group     BP 11/01/16 0827 119/72     Pulse Rate 11/01/16 0827 63     Resp 11/01/16 0827 16     Temp 11/01/16 0827 98.2 F (36.8 C)     Temp Source 11/01/16 0827 Oral     SpO2 11/01/16 0827 96 %     Weight 11/01/16 0825 202 lb (91.6 kg)     Height 11/01/16 0825 5\' 9"  (1.753 m)     Head Circumference --      Peak Flow --      Pain Score 11/01/16 0825 6     Pain Loc --      Pain Edu? --      Excl.  in [place]? --    Updated Vital Signs BP 119/72 (BP Location: Left Arm)   Pulse 63   Temp 98.2 F (36.8 C) (Oral)   Resp 16   Ht 5\' 9"  (1.753 m)   Wt 202 lb (91.6 kg)   SpO2 96%   BMI 29.83 kg/m   Physical Exam  Constitutional: He is oriented to person, place, and time. He appears well-developed. No distress.  HENT:  Head: Normocephalic and atraumatic.  Pulmonary/Chest: Effort normal and breath sounds normal.  Neurological: He is alert and oriented to person, place, and time.  Skin:  Right foot - heel, plantar aspect with a small puncture wound. No surrounding erythema. No drainage.  Psychiatric: He has a normal mood and affect.  Vitals reviewed.  UC Treatments / Results  Labs (all labs ordered are listed, but only abnormal results are displayed) Labs Reviewed - No data to display  EKG  EKG Interpretation None       Radiology No results found.  Procedures Procedures (including critical care time)  Medications Ordered in UC Medications  Tdap (BOOSTRIX) injection 0.5 mL (0.5 mLs Intramuscular Given 11/01/16 0855)     Initial Impression / Assessment and Plan / UC Course  I have reviewed the triage vital signs and the nursing notes.  Pertinent labs & imaging results that were available during my care of the patient were reviewed by me and considered in my medical decision making (see chart for details).   66 year old male with type 2 diabetes presents with complaints of a puncture wound. Tdap given today. Prophylactic antibiotic given (as he is at higher risk). Levaquin used to cover for Pseudomonas (puncture wound of the plantar aspect of the foot, through the shoe).   Final Clinical Impressions(s) / UC Diagnoses   Final diagnoses:  Puncture wound   New Prescriptions Discharge Medication List as of 11/01/2016  8:52 AM    START taking these medications   Details  levofloxacin (LEVAQUIN) 500 MG tablet Take 1 tablet (500 mg total) by mouth daily., Starting Sun  11/01/2016, Normal         [place], [place], [place] 11/01/16 [id number]

## 2016-11-01 NOTE — Discharge Instructions (Signed)
Levaquin as prescribed.  If you worsen, please let us know.  Take care  Dr. Lacinda Axon

## 2016-11-27 ENCOUNTER — Encounter: Payer: Self-pay | Admitting: *Deleted

## 2016-11-30 ENCOUNTER — Encounter: Payer: Self-pay | Admitting: Anesthesiology

## 2016-11-30 ENCOUNTER — Ambulatory Visit: Payer: BLUE CROSS/BLUE SHIELD | Admitting: Anesthesiology

## 2016-11-30 ENCOUNTER — Ambulatory Visit
Admission: RE | Admit: 2016-11-30 | Discharge: 2016-11-30 | Disposition: A | Discharge status: Home / Self Care | Payer: BLUE CROSS/BLUE SHIELD | Source: Ambulatory Visit | Attending: Unknown Physician Specialty | Admitting: Unknown Physician Specialty

## 2016-11-30 ENCOUNTER — Encounter
Admission: RE | Disposition: A | Discharge status: Home / Self Care | Payer: Self-pay | Source: Ambulatory Visit | Attending: Unknown Physician Specialty

## 2016-11-30 DIAGNOSIS — Z7984 Long term (current) use of oral hypoglycemic drugs: Secondary | ICD-10-CM | POA: Insufficient documentation

## 2016-11-30 DIAGNOSIS — I10 Essential (primary) hypertension: Secondary | ICD-10-CM | POA: Diagnosis not present

## 2016-11-30 DIAGNOSIS — N400 Enlarged prostate without lower urinary tract symptoms: Secondary | ICD-10-CM | POA: Insufficient documentation

## 2016-11-30 DIAGNOSIS — Z1211 Encounter for screening for malignant neoplasm of colon: Secondary | ICD-10-CM | POA: Diagnosis present

## 2016-11-30 DIAGNOSIS — K640 First degree hemorrhoids: Secondary | ICD-10-CM | POA: Diagnosis not present

## 2016-11-30 DIAGNOSIS — Z7982 Long term (current) use of aspirin: Secondary | ICD-10-CM | POA: Diagnosis not present

## 2016-11-30 DIAGNOSIS — D122 Benign neoplasm of ascending colon: Secondary | ICD-10-CM | POA: Diagnosis not present

## 2016-11-30 DIAGNOSIS — E119 Type 2 diabetes mellitus without complications: Secondary | ICD-10-CM | POA: Diagnosis not present

## 2016-11-30 DIAGNOSIS — Z79899 Other long term (current) drug therapy: Secondary | ICD-10-CM | POA: Diagnosis not present

## 2016-11-30 DIAGNOSIS — G4730 Sleep apnea, unspecified: Secondary | ICD-10-CM | POA: Insufficient documentation

## 2016-11-30 HISTORY — PX: COLONOSCOPY WITH PROPOFOL: SHX5780

## 2016-11-30 HISTORY — DX: Sleep apnea, unspecified: G47.30

## 2016-11-30 HISTORY — DX: Hyperlipidemia, unspecified: E78.5

## 2016-11-30 LAB — GLUCOSE, CAPILLARY: Glucose-Capillary: 129 mg/dL — ABNORMAL HIGH (ref 65–99)

## 2016-11-30 SURGERY — COLONOSCOPY WITH PROPOFOL
Anesthesia: General

## 2016-11-30 MED ORDER — FENTANYL CITRATE (PF) 100 MCG/2ML IJ SOLN
INTRAMUSCULAR | Status: DC | PRN
  Administered 2016-11-30: 50 ug via INTRAVENOUS

## 2016-11-30 MED ORDER — PROPOFOL 500 MG/50ML IV EMUL
INTRAVENOUS | Status: AC
Start: 2016-11-30 — End: ?
  Filled 2016-11-30: qty 50

## 2016-11-30 MED ORDER — FENTANYL CITRATE (PF) 100 MCG/2ML IJ SOLN
INTRAMUSCULAR | Status: AC
  Filled 2016-11-30: qty 2

## 2016-11-30 MED ORDER — PROPOFOL 500 MG/50ML IV EMUL
INTRAVENOUS | Status: DC | PRN
  Administered 2016-11-30: 120 ug/kg/min via INTRAVENOUS

## 2016-11-30 MED ORDER — MIDAZOLAM HCL 2 MG/2ML IJ SOLN
INTRAMUSCULAR | Status: DC | PRN
  Administered 2016-11-30: 2 mg via INTRAVENOUS

## 2016-11-30 MED ORDER — SODIUM CHLORIDE 0.9 % IV SOLN
INTRAVENOUS | Status: DC
  Administered 2016-11-30: 08:00:00 via INTRAVENOUS

## 2016-11-30 MED ORDER — MIDAZOLAM HCL 2 MG/2ML IJ SOLN
INTRAMUSCULAR | Status: AC
Start: 2016-11-30 — End: ?
  Filled 2016-11-30: qty 2

## 2016-11-30 NOTE — Transfer of Care (Signed)
Immediate Anesthesia Transfer of Care Note  Patient: Phillip Le  Procedure(s) Performed: Procedure(s): COLONOSCOPY WITH PROPOFOL (N/A)  Patient Location: PACU  Anesthesia Type:General  Level of Consciousness: awake and sedated  Airway & Oxygen Therapy: Patient Spontanous Breathing and Patient connected to nasal cannula oxygen  Post-op Assessment: Report given to RN and Post -op Vital signs reviewed and stable  Post vital signs: Reviewed and stable  Last Vitals:  Vitals:   11/30/16 0757  BP: (!) 162/83  Pulse: 60  Resp: 18  Temp: (!) 35.7 C  SpO2: 99%    Last Pain:  Vitals:   11/30/16 0757  TempSrc: Tympanic      Patients Stated Pain Goal: 0 (76/81/15 7262)  Complications: No apparent anesthesia complications

## 2016-11-30 NOTE — Anesthesia Postprocedure Evaluation (Signed)
Anesthesia Post Note  Patient: Phillip Le  Procedure(s) Performed: Procedure(s) (LRB): COLONOSCOPY WITH PROPOFOL (N/A)  Patient location during evaluation: PACU Anesthesia Type: General Level of consciousness: awake and alert and oriented Pain management: pain level controlled Vital Signs Assessment: post-procedure vital signs reviewed and stable Respiratory status: spontaneous breathing Cardiovascular status: blood pressure returned to baseline Anesthetic complications: no     Last Vitals:  Vitals:   11/30/16 0907 11/30/16 0917  BP: 119/70 113/75  Pulse: (!) 57 (!) 50  Resp: 15 11  Temp:    SpO2: 99% 98%    Last Pain:  Vitals:   11/30/16 0847  TempSrc: Tympanic                 Britnie Colville

## 2016-11-30 NOTE — Anesthesia Preprocedure Evaluation (Addendum)
Anesthesia Evaluation  Patient identified by MRN, date of birth, ID band Patient awake    Reviewed: Allergy & Precautions, NPO status , Patient's Chart, lab work & pertinent test results, reviewed documented beta blocker date and time   Airway Mallampati: II  TM Distance: >3 FB     Dental no notable dental hx.    Pulmonary sleep apnea ,    Pulmonary exam normal        Cardiovascular hypertension, Pt. on medications and Pt. on home beta blockers Normal cardiovascular exam     Neuro/Psych negative neurological ROS  negative psych ROS   GI/Hepatic negative GI ROS, Neg liver ROS,   Endo/Other  diabetes  Renal/GU negative Renal ROS  negative genitourinary   Musculoskeletal negative musculoskeletal ROS (+)   Abdominal Normal abdominal exam  (+)   Peds negative pediatric ROS (+)  Hematology negative hematology ROS (+)   Anesthesia Other Findings   Reproductive/Obstetrics                             Anesthesia Physical Anesthesia Plan  ASA: II  Anesthesia Plan: General   Post-op Pain Management:    Induction: Intravenous  PONV Risk Score and Plan:   Airway Management Planned: Nasal Cannula  Additional Equipment:   Intra-op Plan:   Post-operative Plan:   Informed Consent: I have reviewed the patients History and Physical, chart, labs and discussed the procedure including the risks, benefits and alternatives for the proposed anesthesia with the patient or authorized representative who has indicated his/her understanding and acceptance.   Dental advisory given  Plan Discussed with: CRNA and Surgeon  Anesthesia Plan Comments:         Anesthesia Quick Evaluation

## 2016-11-30 NOTE — Anesthesia Post-op Follow-up Note (Signed)
Anesthesia QCDR form completed.        

## 2016-11-30 NOTE — Anesthesia Procedure Notes (Signed)
Performed by: COOK-MARTIN, [place] Pre-anesthesia Checklist: Patient identified, Emergency Drugs available, Suction available, Patient being monitored and Timeout performed Patient Re-evaluated:Patient Re-evaluated prior to induction Oxygen Delivery Method: Nasal cannula Preoxygenation: Pre-oxygenation with 100% oxygen Induction Type: IV induction Placement Confirmation: positive ETCO2 and CO2 detector       

## 2016-11-30 NOTE — Op Note (Signed)
[facility] Gastroenterology Patient Name: Phillip Le Procedure Date: 11/30/2016 8:05 AM MRN: [id number] Account #: [id number] Date of Birth: 1951/01/19 Admit Type: Outpatient Age: 66 Room: [facility] ENDO ROOM 1 Gender: Male Note Status: Finalized Procedure:            Colonoscopy Indications:          Screening for colorectal malignant neoplasm Providers:            Manya Silvas, MD Referring MD:         Sofie Hartigan (Referring MD) Medicines:            Propofol per Anesthesia Complications:        No immediate complications. Procedure:            Pre-Anesthesia Assessment:                       - After reviewing the risks and benefits, the patient                        was deemed in satisfactory condition to undergo the                        procedure.                       After obtaining informed consent, the colonoscope was                        passed under direct vision. Throughout the procedure,                        the patient's blood pressure, pulse, and oxygen                        saturations were monitored continuously. The                        Colonoscope was introduced through the anus and                        advanced to the the cecum, identified by appendiceal                        orifice and ileocecal valve. The colonoscopy was                        performed without difficulty. The patient tolerated the                        procedure well. The quality of the bowel preparation                        was excellent. Findings:      A small polyp was found in the ascending colon. The polyp was sessile.       The polyp was removed with a jumbo cold forceps. Resection and retrieval       were complete.      Internal hemorrhoids were found during endoscopy. The hemorrhoids were       small and Grade I (internal hemorrhoids that do not prolapse).      The exam was  otherwise without abnormality.      Right side of prostate felt  mildly enlarged and firmer than the left. Impression:           - One small polyp in the ascending colon, removed with                        a jumbo cold forceps. Resected and retrieved.                       - Internal hemorrhoids.                       - The examination was otherwise normal. Recommendation:       - Await pathology results. See primary doctor or a                        Urologist for prostate. Manya Silvas, MD 11/30/2016 8:44:42 AM This report has been signed electronically. Number of Addenda: 0 Note Initiated On: 11/30/2016 8:05 AM Scope Withdrawal Time: 0 hours 7 minutes 40 seconds  Total Procedure Duration: 0 hours 13 minutes 43 seconds       [facility]

## 2016-11-30 NOTE — H&P (Signed)
Primary Care Physician:  Sofie Hartigan, MD Primary Gastroenterologist:  Dr. Vira Agar  Pre-Procedure History & Physical: HPI:  Phillip Le is a 66 y.o. male is here for an colonoscopy.   Past Medical History:  Diagnosis Date  . Allergy   . Diabetes mellitus without complication ([place])   . Elevated lipids   . Gout   . Hypertension   . Sleep apnea     Past Surgical History:  Procedure Laterality Date  . ADENOIDECTOMY    . APPENDECTOMY    . TONSILLECTOMY      Prior to Admission medications   Medication Sig Start Date End Date Taking? Authorizing Provider  albuterol (PROVENTIL HFA;VENTOLIN HFA) 108 (90 Base) MCG/ACT inhaler Inhale 2 puffs into the lungs every 6 (six) hours as needed for wheezing. Patient not taking: Reported on 11/30/2016 07/01/16   Marylene Land, NP  aspirin (ASPIR-LOW) 81 MG EC tablet Take by mouth. 10/25/08   [provider]  benzonatate (TESSALON PERLES) 100 MG capsule Take 1 capsule (100 mg total) by mouth 3 (three) times daily as needed for cough. Patient not taking: Reported on 11/30/2016 07/01/16   Marylene Land, NP  chlorpheniramine-HYDROcodone [facility] ER) 10-8 MG/5ML SUER Take 5 mLs by mouth at bedtime as needed for cough. do not drive or operate machinery while taking as can cause drowsiness. Patient not taking: Reported on 11/30/2016 07/01/16   Marylene Land, NP  doxycycline (VIBRAMYCIN) 100 MG capsule Take 1 capsule (100 mg total) by mouth 2 (two) times daily. Patient not taking: Reported on 11/30/2016 07/01/16   Marylene Land, NP  glucose blood (ACCU-CHEK COMFORT CURVE) test strip  11/12/09   [provider]  Lancets 28G MISC  11/12/09   [provider]  levofloxacin (LEVAQUIN) 500 MG tablet Take 1 tablet (500 mg total) by mouth daily. Patient not taking: Reported on 11/30/2016 11/01/16   Coral Spikes, DO  metFORMIN (GLUCOPHAGE) 500 MG tablet Take 500 mg by mouth 2 (two) times daily with a meal.     [provider]  metoprolol (LOPRESSOR) 50 MG tablet Take by mouth. 01/03/10   [provider]  pravastatin (PRAVACHOL) 10 MG tablet Take 10 mg by mouth daily.    [provider]  predniSONE (DELTASONE) 20 MG tablet Take 2 tablets (40 mg total) by mouth daily. Patient not taking: Reported on 11/30/2016 07/01/16   Marylene Land, NP  ramipril (ALTACE) 10 MG capsule Take by mouth. 10/25/08   [provider]    Allergies as of 09/15/2016  . (No Known Allergies)    Family History  Problem Relation Age of Onset  . CAD Mother   . Cancer Father     Social History   Social History  . Marital status: Married    Spouse name: N/A  . Number of children: N/A  . Years of education: N/A   Occupational History  . Not on file.   Social History Main Topics  . Smoking status: Never Smoker  . Smokeless tobacco: Never Used  . Alcohol use No  . Drug use: No  . Sexual activity: Not on file   Other Topics Concern  . Not on file   Social History Narrative  . No narrative on file    Review of Systems: See HPI, otherwise negative ROS  Physical Exam: BP (!) 162/83   Pulse 60   Temp (!) 96.2 F (35.7 C) (Tympanic)   Resp 18   Ht 5\' 9"  (1.753 m)  Wt 90.7 kg (200 lb)   SpO2 99%   BMI 29.53 kg/m  General:   Alert,  pleasant and cooperative in NAD Head:  Normocephalic and atraumatic. Neck:  Supple; no masses or thyromegaly. Lungs:  Clear throughout to auscultation.    Heart:  Regular rate and rhythm. Abdomen:  Soft, nontender and nondistended. Normal bowel sounds, without guarding, and without rebound.   Neurologic:  Alert and  oriented x4;  grossly normal neurologically.  Impression/Plan: Phillip Le is here for an colonoscopy to be performed for colon screening  Risks, benefits, limitations, and alternatives regarding  colonoscopy have been reviewed with the patient.  Questions have been answered.  All parties agreeable.   Gaylyn Cheers, MD  11/30/2016, 8:11 AM

## 2016-12-01 ENCOUNTER — Encounter: Payer: Self-pay | Admitting: Unknown Physician Specialty

## 2016-12-01 LAB — SURGICAL PATHOLOGY

## 2016-12-11 ENCOUNTER — Ambulatory Visit: Payer: Self-pay | Admitting: Urology

## 2017-01-04 ENCOUNTER — Ambulatory Visit (INDEPENDENT_AMBULATORY_CARE_PROVIDER_SITE_OTHER): Payer: BLUE CROSS/BLUE SHIELD | Admitting: Podiatry

## 2017-01-04 ENCOUNTER — Ambulatory Visit (INDEPENDENT_AMBULATORY_CARE_PROVIDER_SITE_OTHER): Payer: BLUE CROSS/BLUE SHIELD

## 2017-01-04 ENCOUNTER — Encounter: Payer: Self-pay | Admitting: Podiatry

## 2017-01-04 VITALS — BP 130/77 | HR 83 | Resp 16

## 2017-01-04 DIAGNOSIS — D3610 Benign neoplasm of peripheral nerves and autonomic nervous system, unspecified: Secondary | ICD-10-CM | POA: Diagnosis not present

## 2017-01-04 DIAGNOSIS — M722 Plantar fascial fibromatosis: Secondary | ICD-10-CM

## 2017-01-04 DIAGNOSIS — M779 Enthesopathy, unspecified: Secondary | ICD-10-CM | POA: Diagnosis not present

## 2017-01-04 NOTE — Progress Notes (Signed)
Subjective:  Patient ID: [name], male    DOB: [birth year],  MRN: [id number] HPI Chief Complaint  Patient presents with  . Foot Pain    Plantar arch bilateral - left over right, soft knots for years, Dr. Paulla Dolly injected before-helped but they come back, also plantar forefoot pain left     66 y.o. male presents with the above complaint.     Past Medical History:  Diagnosis Date  . Allergy   . Diabetes mellitus without complication ([place])   . Elevated lipids   . Gout   . Hypertension   . Sleep apnea    Past Surgical History:  Procedure Laterality Date  . ADENOIDECTOMY    . APPENDECTOMY    . COLONOSCOPY WITH PROPOFOL N/A 11/30/2016   Procedure: COLONOSCOPY WITH PROPOFOL;  Surgeon: Manya Silvas, MD;  Location: [facility] ENDOSCOPY;  Service: Endoscopy;  Laterality: N/A;  . TONSILLECTOMY      Current Outpatient Prescriptions:  .  albuterol (PROVENTIL HFA;VENTOLIN HFA) 108 (90 Base) MCG/ACT inhaler, Inhale 2 puffs into the lungs every 6 (six) hours as needed for wheezing. (Patient not taking: Reported on 11/30/2016), Disp: 1 Inhaler, Rfl: 0 .  aspirin (ASPIR-LOW) 81 MG EC tablet, Take by mouth., Disp: , Rfl:  .  chlorpheniramine-HYDROcodone (TUSSIONEX PENNKINETIC ER) 10-8 MG/5ML SUER, Take 5 mLs by mouth at bedtime as needed for cough. do not drive or operate machinery while taking as can cause drowsiness. (Patient not taking: Reported on 11/30/2016), Disp: 75 mL, Rfl: 0 .  doxycycline (VIBRAMYCIN) 100 MG capsule, Take 1 capsule (100 mg total) by mouth 2 (two) times daily. (Patient not taking: Reported on 11/30/2016), Disp: 20 capsule, Rfl: 0 .  glucose blood (ACCU-CHEK COMFORT CURVE) test strip, , Disp: , Rfl:  .  Lancets 28G MISC, , Disp: , Rfl:  .  metFORMIN (GLUCOPHAGE) 500 MG tablet, Take 500 mg by mouth 2 (two) times daily with a meal., Disp: , Rfl:  .  metoprolol (LOPRESSOR) 50 MG tablet, Take by mouth., Disp: , Rfl:  .  polyethylene glycol-electrolytes (NULYTELY/GOLYTELY)  420 g solution, , Disp: , Rfl:  .  pravastatin (PRAVACHOL) 10 MG tablet, Take 10 mg by mouth daily., Disp: , Rfl:  .  predniSONE (DELTASONE) 20 MG tablet, Take 2 tablets (40 mg total) by mouth daily. (Patient not taking: Reported on 11/30/2016), Disp: 10 tablet, Rfl: 0 .  ramipril (ALTACE) 10 MG capsule, Take by mouth., Disp: , Rfl:   No Known Allergies Review of Systems  All other systems reviewed and are negative.  Objective:   Vitals:   01/04/17 1413  BP: 130/77  Pulse: 83  Resp: 16    General: Well developed, nourished, in no acute distress, alert and oriented x3   Dermatological: Skin is warm, dry and supple bilateral. Nails x 10 are well maintained; remaining integument appears unremarkable at this time. There are no open sores, no preulcerative lesions, no rash or signs of infection present.  Vascular: Dorsalis Pedis artery and Posterior Tibial artery pedal pulses are 2/4 bilateral with immedate capillary fill time. Pedal hair growth present. No varicosities and no lower extremity edema present bilateral.   Neruologic: Grossly intact via light touch bilateral. Vibratory intact via tuning fork bilateral. Protective threshold with Semmes Wienstein monofilament intact to all pedal sites bilateral. Patellar and Achilles deep tendon reflexes 2+ bilateral. No Babinski or clonus noted bilateral. He has pain on palpation to the third interdigital space of the left foot. Palpable Mulder's click is  noted.  Musculoskeletal: No gross boney pedal deformities bilateral. No pain, crepitus, or limitation noted with foot and ankle range of motion bilateral. Muscular strength 5/5 in all groups tested bilateral. He has 2 large soft tissue masses 1 beneath the medial aspect of each foot just proximal to the first metatarsophalangeal joint. They both measure similar in diameter of approximately 2 cm the right one appears to be thicker and more firm.   Gait: Unassisted, Nonantalgic.     Radiographs:  Radiographs taken in the office today 3 views both feet demonstrates soft tissue masses in the first metatarsophalangeal joint just proximal to that area. There is no signs of calcification within the masses otherwise normal radiographic study.  Assessment & Plan:   Assessment: Neuroma third interdigital space left foot. Plantar fibromatosis bilateral foot. Dupuytren's contracture right hand.  Plan: Recommended that he follow-up with a hand surgeon. I injected each large lesion today with 20 mg of Kenalog and local anesthetic. This should help shrink these fibrotic lesions. Also injected the foot with Kenalog overlying the neuroma third interdigital space left. I will follow-up with him in 6 weeks. We discussed appropriate shoe gear stretching exercises and ice therapy.     Shahmeer Bunn T. [place], [place]

## 2017-02-15 ENCOUNTER — Encounter: Payer: Self-pay | Admitting: Podiatry

## 2017-02-15 ENCOUNTER — Ambulatory Visit (INDEPENDENT_AMBULATORY_CARE_PROVIDER_SITE_OTHER): Payer: BLUE CROSS/BLUE SHIELD | Admitting: Podiatry

## 2017-02-15 DIAGNOSIS — M722 Plantar fascial fibromatosis: Secondary | ICD-10-CM | POA: Diagnosis not present

## 2017-02-15 DIAGNOSIS — G5791 Unspecified mononeuropathy of right lower limb: Secondary | ICD-10-CM

## 2017-02-15 NOTE — Progress Notes (Signed)
He presents today for follow-up of neuroma of the left foot and plantar fibromas bilaterally. States it has still got a lot of pain in the mornings but the left foot from that other shot is feeling okay.  Objective: Vital signs are stable he is alert and oriented 3. Plantar fibromas medial aspect of the plantar medial longitudinal arch has decreased in size. He still has palpable Mulder's click third interspace of the left foot.  Assessment: Plantar fibromatosis bilaterally. Neuroma third interspace left.  Plan: Injected the third interdigital space today with dehydrated alcohol and I also injected the plantar fibromas with Kenalog and local anesthetic today follow-up with him in 1 month.

## 2017-03-15 ENCOUNTER — Ambulatory Visit: Payer: BLUE CROSS/BLUE SHIELD | Admitting: Podiatry

## 2017-05-24 ENCOUNTER — Other Ambulatory Visit: Payer: Self-pay

## 2017-05-24 ENCOUNTER — Ambulatory Visit
Admission: EM | Admit: 2017-05-24 | Discharge: 2017-05-24 | Disposition: A | Discharge status: Home / Self Care | Payer: BLUE CROSS/BLUE SHIELD | Attending: Family Medicine | Admitting: Family Medicine

## 2017-05-24 DIAGNOSIS — J0100 Acute maxillary sinusitis, unspecified: Secondary | ICD-10-CM | POA: Diagnosis not present

## 2017-05-24 DIAGNOSIS — R05 Cough: Secondary | ICD-10-CM

## 2017-05-24 DIAGNOSIS — R059 Cough, unspecified: Secondary | ICD-10-CM

## 2017-05-24 MED ORDER — ALBUTEROL SULFATE HFA 108 (90 BASE) MCG/ACT IN AERS: 1.0000 | INHALATION_SPRAY | Freq: Four times a day (QID) | RESPIRATORY_TRACT | 0 refills | Status: DC | PRN

## 2017-05-24 MED ORDER — AMOXICILLIN 875 MG PO TABS: 875.0000 mg | ORAL_TABLET | Freq: Two times a day (BID) | ORAL | 0 refills | Status: DC

## 2017-05-24 MED ORDER — BENZONATATE 200 MG PO CAPS
200.0000 mg | ORAL_CAPSULE | Freq: Three times a day (TID) | ORAL | 0 refills | Status: DC | PRN
Start: 2017-05-24 — End: 2022-04-27

## 2017-05-24 NOTE — ED Provider Notes (Signed)
[facility]    CSN: [id number] Arrival date & time: 05/24/17  0902     History   Chief Complaint Chief Complaint  Patient presents with  . Nasal Congestion    HPI IZEYAH DEIKE is a 67 y.o. male.   The history is provided by the patient.  URI  Presenting symptoms: congestion, cough, facial pain and rhinorrhea   Severity:  Moderate Onset quality:  Sudden Duration:  1 week Timing:  Constant Progression:  Worsening Chronicity:  New Relieved by:  None tried Ineffective treatments:  None tried Associated symptoms: headaches and sinus pain   Associated symptoms: no wheezing   Risk factors: diabetes mellitus, recent travel and sick contacts   Risk factors: not elderly, no chronic cardiac disease, no chronic kidney disease, no chronic respiratory disease, no immunosuppression and no recent illness     Past Medical History:  Diagnosis Date  . Allergy   . Diabetes mellitus without complication ([place])   . Elevated lipids   . Gout   . Hypertension   . Sleep apnea     Patient Active Problem List   Diagnosis Date Noted  . Intermittent palpitations 02/09/2014  . Type II diabetes mellitus ([place]) 09/15/2013  . Hypertension 09/15/2013  . Other and unspecified hyperlipidemia 09/15/2013  . Sleep apnea 09/15/2013    Past Surgical History:  Procedure Laterality Date  . ADENOIDECTOMY    . APPENDECTOMY    . COLONOSCOPY WITH PROPOFOL N/A 11/30/2016   Procedure: COLONOSCOPY WITH PROPOFOL;  Surgeon: Manya Silvas, MD;  Location: [facility] ENDOSCOPY;  Service: Endoscopy;  Laterality: N/A;  . TONSILLECTOMY         Home Medications    Prior to Admission medications   Medication Sig Start Date End Date Taking? Authorizing Provider  albuterol (PROVENTIL HFA;VENTOLIN HFA) 108 (90 Base) MCG/ACT inhaler Inhale 1-2 puffs into the lungs every 6 (six) hours as needed for wheezing. 05/24/17   Norval Gable, MD  amoxicillin (AMOXIL) 875 MG tablet Take 1 tablet (875 mg  total) by mouth 2 (two) times daily. 05/24/17   Norval Gable, MD  aspirin (ASPIR-LOW) 81 MG EC tablet Take by mouth. 10/25/08   [provider]  benzonatate (TESSALON) 200 MG capsule Take 1 capsule (200 mg total) by mouth 3 (three) times daily as needed. 05/24/17   Norval Gable, MD  chlorpheniramine-HYDROcodone (TUSSIONEX PENNKINETIC ER) 10-8 MG/5ML SUER Take 5 mLs by mouth at bedtime as needed for cough. do not drive or operate machinery while taking as can cause drowsiness. Patient not taking: Reported on 11/30/2016 07/01/16   Marylene Land, NP  doxycycline (VIBRAMYCIN) 100 MG capsule Take 1 capsule (100 mg total) by mouth 2 (two) times daily. Patient not taking: Reported on 11/30/2016 07/01/16   Marylene Land, NP  glucose blood (ACCU-CHEK COMFORT CURVE) test strip  11/12/09   [provider]  Lancets 28G MISC  11/12/09   [provider]  metFORMIN (GLUCOPHAGE) 500 MG tablet Take 500 mg by mouth 2 (two) times daily with a meal.    [provider]  metoprolol (LOPRESSOR) 50 MG tablet Take by mouth. 01/03/10   [provider]  polyethylene glycol-electrolytes (NULYTELY/GOLYTELY) 420 g solution  10/20/16   [provider]  pravastatin (PRAVACHOL) 10 MG tablet Take 10 mg by mouth daily.    [provider]  predniSONE (DELTASONE) 20 MG tablet Take 2 tablets (40 mg total) by mouth daily. Patient not taking: Reported on 11/30/2016 07/01/16   Marylene Land, NP  ramipril (ALTACE) 10 MG capsule Take by mouth. 10/25/08   [provider]    Family History Family History  Problem Relation Age of Onset  . CAD Mother   . Cancer Father     Social History Social History   Tobacco Use  . Smoking status: Never Smoker  . Smokeless tobacco: Never Used  Substance Use Topics  . Alcohol use: No  . Drug use: No     Allergies   Patient has no known allergies.   Review of Systems Review of Systems  HENT: Positive for congestion,  rhinorrhea and sinus pain.   Respiratory: Positive for cough. Negative for wheezing.   Neurological: Positive for headaches.     Physical Exam Triage Vital Signs ED Triage Vitals  Enc Vitals Group     BP 05/24/17 0936 129/65     Pulse Rate 05/24/17 0936 68     Resp 05/24/17 0936 20     Temp 05/24/17 0936 98.2 F (36.8 C)     Temp Source 05/24/17 0936 Oral     SpO2 05/24/17 0936 95 %     Weight 05/24/17 0937 202 lb (91.6 kg)     Height 05/24/17 0937 5\' 9"  (1.753 m)     Head Circumference --      Peak Flow --      Pain Score 05/24/17 0937 3     Pain Loc --      Pain Edu? --      Excl. in [place]? --    No data found.  Updated Vital Signs BP 129/65 (BP Location: Left Arm)   Pulse 68   Temp 98.2 F (36.8 C) (Oral)   Resp 20   Ht 5\' 9"  (1.753 m)   Wt 202 lb (91.6 kg)   SpO2 95%   BMI 29.83 kg/m   Visual Acuity Right Eye Distance:   Left Eye Distance:   Bilateral Distance:    Right Eye Near:   Left Eye Near:    Bilateral Near:     Physical Exam  Constitutional: He appears well-developed and well-nourished. No distress.  HENT:  Head: Normocephalic and atraumatic.  Right Ear: Tympanic membrane, external ear and ear canal normal.  Left Ear: Tympanic membrane, external ear and ear canal normal.  Nose: Right sinus exhibits maxillary sinus tenderness. Left sinus exhibits maxillary sinus tenderness.  Mouth/Throat: Uvula is midline, oropharynx is clear and moist and mucous membranes are normal. No oropharyngeal exudate or tonsillar abscesses.  Eyes: Conjunctivae are normal. Right eye exhibits no discharge. Left eye exhibits no discharge. No scleral icterus.  Neck: Normal range of motion. Neck supple. No tracheal deviation present. No thyromegaly present.  Cardiovascular: Normal rate, regular rhythm and normal heart sounds.  Pulmonary/Chest: Effort normal and breath sounds normal. No stridor. No respiratory distress. He has no wheezes. He has no rales. He exhibits no  tenderness.  Lymphadenopathy:    He has no cervical adenopathy.  Neurological: He is alert.  Skin: Skin is warm and dry. No rash noted. He is not diaphoretic.  Nursing note and vitals reviewed.    UC Treatments / Results  Labs (all labs ordered are listed, but only abnormal results are displayed) Labs Reviewed - No data to display  EKG  EKG Interpretation None       Radiology No results found.  Procedures Procedures (including critical care time)  Medications Ordered in UC Medications - No data to display   Initial Impression / Assessment and Plan /  UC Course  I have reviewed the triage vital signs and the nursing notes.  Pertinent labs & imaging results that were available during my care of the patient were reviewed by me and considered in my medical decision making (see chart for details).       Final Clinical Impressions(s) / UC Diagnoses   Final diagnoses:  Acute maxillary sinusitis, recurrence not specified  Cough    ED Discharge Orders        Ordered    amoxicillin (AMOXIL) 875 MG tablet  2 times daily     05/24/17 1003    benzonatate (TESSALON) 200 MG capsule  3 times daily PRN     05/24/17 1003    albuterol (PROVENTIL HFA;VENTOLIN HFA) 108 (90 Base) MCG/ACT inhaler  Every 6 hours PRN     05/24/17 1003     1.  diagnosis reviewed with patient 2. rx as per orders above; reviewed possible side effects, interactions, risks and benefits  3. Recommend supportive treatment with otc flonase 4. Follow-up prn if symptoms worsen or don't improve Controlled Substance Prescriptions [place] Controlled Substance Registry consulted? Not Applicable   Norval Gable, MD 05/24/17 1012

## 2017-05-24 NOTE — ED Triage Notes (Signed)
Pt reports sinus drainage, pressure in ears, head feels full, and scratchy throat. No fever.

## 2017-06-11 ENCOUNTER — Emergency Department
Admission: EM | Admit: 2017-06-11 | Discharge: 2017-06-11 | Disposition: A | Discharge status: Home / Self Care | Payer: BLUE CROSS/BLUE SHIELD | Attending: Emergency Medicine | Admitting: Emergency Medicine

## 2017-06-11 ENCOUNTER — Other Ambulatory Visit: Payer: Self-pay

## 2017-06-11 ENCOUNTER — Emergency Department: Payer: BLUE CROSS/BLUE SHIELD

## 2017-06-11 DIAGNOSIS — Z79899 Other long term (current) drug therapy: Secondary | ICD-10-CM | POA: Diagnosis not present

## 2017-06-11 DIAGNOSIS — E119 Type 2 diabetes mellitus without complications: Secondary | ICD-10-CM | POA: Diagnosis not present

## 2017-06-11 DIAGNOSIS — Z7984 Long term (current) use of oral hypoglycemic drugs: Secondary | ICD-10-CM | POA: Diagnosis not present

## 2017-06-11 DIAGNOSIS — Z7902 Long term (current) use of antithrombotics/antiplatelets: Secondary | ICD-10-CM | POA: Diagnosis not present

## 2017-06-11 DIAGNOSIS — R072 Precordial pain: Secondary | ICD-10-CM

## 2017-06-11 DIAGNOSIS — R079 Chest pain, unspecified: Secondary | ICD-10-CM | POA: Diagnosis present

## 2017-06-11 DIAGNOSIS — I10 Essential (primary) hypertension: Secondary | ICD-10-CM | POA: Insufficient documentation

## 2017-06-11 LAB — CBC
HEMATOCRIT: 49 % (ref 40.0–52.0)
HEMOGLOBIN: 16.9 g/dL (ref 13.0–18.0)
MCH: 31.2 pg (ref 26.0–34.0)
MCHC: 34.6 g/dL (ref 32.0–36.0)
MCV: 90.3 fL (ref 80.0–100.0)
Platelets: 119 10*3/uL — ABNORMAL LOW (ref 150–440)
RBC: 5.42 MIL/uL (ref 4.40–5.90)
RDW: 13.5 % (ref 11.5–14.5)
WBC: 7.1 10*3/uL (ref 3.8–10.6)

## 2017-06-11 LAB — BASIC METABOLIC PANEL
ANION GAP: 10 (ref 5–15)
BUN: 20 mg/dL (ref 6–20)
CO2: 21 mmol/L — AB (ref 22–32)
Calcium: 9.5 mg/dL (ref 8.9–10.3)
Chloride: 106 mmol/L (ref 101–111)
Creatinine, Ser: 0.65 mg/dL (ref 0.61–1.24)
GFR calc Af Amer: >60 mL/min (ref >60)
GFR calc non Af Amer: >60 mL/min (ref >60)
Glucose, Bld: 139 mg/dL — ABNORMAL HIGH (ref 65–99)
POTASSIUM: 4.2 mmol/L (ref 3.5–5.1)
Sodium: 137 mmol/L (ref 135–145)

## 2017-06-11 LAB — TROPONIN I
Troponin I: 0.03 ng/mL (ref <0.03)
Troponin I: 0.03 ng/mL (ref <0.03)

## 2017-06-11 MED ORDER — ASPIRIN 81 MG PO CHEW
324.0000 mg | CHEWABLE_TABLET | Freq: Once | ORAL | Status: AC
  Administered 2017-06-11: 324 mg via ORAL

## 2017-06-11 NOTE — ED Provider Notes (Signed)
[facility] Emergency Department Provider Note  ____________________________________________  Time seen: Approximately 6:39 PM  I have reviewed the triage vital signs and the nursing notes.   HISTORY  Chief Complaint Chest Pain    HPI Phillip Le is a 67 y.o. male he complains of central chest pain described as tightness and dull ache, radiating down both arms, 2 episodes over the last 24 hours. Both episodes lasted about 10 minutes, occur during exertion with yardwork, relieved with coming inside and resting. He thinks it might be due to breathing very cold air, as he has been doing lots of exertion recently without getting chest pain. Pain is associated shortness of breath but no vomiting or diaphoresis. No paresthesias weakness or other neurologic deficits in the extremities. Patient took 2 baby aspirin yesterday. Pain is currently resolved, most recent pain was at 10 AM today.     Past Medical History:  Diagnosis Date  . Allergy   . Diabetes mellitus without complication ([place])   . Elevated lipids   . Gout   . Hypertension   . Sleep apnea      Patient Active Problem List   Diagnosis Date Noted  . Intermittent palpitations 02/09/2014  . Type II diabetes mellitus ([place]) 09/15/2013  . Hypertension 09/15/2013  . Other and unspecified hyperlipidemia 09/15/2013  . Sleep apnea 09/15/2013     Past Surgical History:  Procedure Laterality Date  . ADENOIDECTOMY    . APPENDECTOMY    . COLONOSCOPY WITH PROPOFOL N/A 11/30/2016   Procedure: COLONOSCOPY WITH PROPOFOL;  Surgeon: Manya Silvas, MD;  Location: [facility] ENDOSCOPY;  Service: Endoscopy;  Laterality: N/A;  . TONSILLECTOMY       Prior to Admission medications   Medication Sig Start Date End Date Taking? Authorizing Provider  albuterol (PROVENTIL HFA;VENTOLIN HFA) 108 (90 Base) MCG/ACT inhaler Inhale 1-2 puffs into the lungs every 6 (six) hours as needed for wheezing. 05/24/17   Norval Gable, MD  amoxicillin (AMOXIL) 875 MG tablet Take 1 tablet (875 mg total) by mouth 2 (two) times daily. 05/24/17   Norval Gable, MD  aspirin (ASPIR-LOW) 81 MG EC tablet Take by mouth. 10/25/08   [provider]  benzonatate (TESSALON) 200 MG capsule Take 1 capsule (200 mg total) by mouth 3 (three) times daily as needed. 05/24/17   Norval Gable, MD  chlorpheniramine-HYDROcodone (TUSSIONEX PENNKINETIC ER) 10-8 MG/5ML SUER Take 5 mLs by mouth at bedtime as needed for cough. do not drive or operate machinery while taking as can cause drowsiness. Patient not taking: Reported on 11/30/2016 07/01/16   Marylene Land, NP  doxycycline (VIBRAMYCIN) 100 MG capsule Take 1 capsule (100 mg total) by mouth 2 (two) times daily. Patient not taking: Reported on 11/30/2016 07/01/16   Marylene Land, NP  glucose blood (ACCU-CHEK COMFORT CURVE) test strip  11/12/09   [provider]  Lancets 28G MISC  11/12/09   [provider]  metFORMIN (GLUCOPHAGE) 500 MG tablet Take 500 mg by mouth 2 (two) times daily with a meal.    [provider]  metoprolol (LOPRESSOR) 50 MG tablet Take by mouth. 01/03/10   [provider]  polyethylene glycol-electrolytes (NULYTELY/GOLYTELY) 420 g solution  10/20/16   [provider]  pravastatin (PRAVACHOL) 10 MG tablet Take 10 mg by mouth daily.    [provider]  predniSONE (DELTASONE) 20 MG tablet Take 2 tablets (40 mg total) by mouth daily. Patient not taking: Reported on 11/30/2016 07/01/16   Marylene Land,  NP  ramipril (ALTACE) 10 MG capsule Take by mouth. 10/25/08   [provider]     Allergies Patient has no known allergies.   Family History  Problem Relation Age of Onset  . CAD Mother   . Cancer Father     Social History Social History   Tobacco Use  . Smoking status: Never Smoker  . Smokeless tobacco: Never Used  Substance Use Topics  . Alcohol use: No  . Drug use: No    Review of  Systems  Constitutional:   No fever or chills.  ENT:   No sore throat. No rhinorrhea. Cardiovascular:   positive as above chest pain without syncope. Respiratory:   No dyspnea or cough. Gastrointestinal:   Negative for abdominal pain, vomiting and diarrhea.  Musculoskeletal:   Negative for focal pain or swelling All other systems reviewed and are negative except as documented above in ROS and HPI.  ____________________________________________   PHYSICAL EXAM:  VITAL SIGNS: ED Triage Vitals [06/11/17 1107]  Enc Vitals Group     BP 117/76     Pulse Rate 64     Resp 18     Temp (!) 97.5 F (36.4 C)     Temp Source Oral     SpO2 94 %     Weight      Height      Head Circumference      Peak Flow      Pain Score 0     Pain Loc      Pain Edu?      Excl. in [place]?     Vital signs reviewed, nursing assessments reviewed.   Constitutional:   Alert and oriented. Well appearing and in no distress. Eyes:   No scleral icterus.  EOMI. No nystagmus. No conjunctival pallor. PERRL. ENT   Head:   Normocephalic and atraumatic.   Nose:   No congestion/rhinnorhea.    Mouth/Throat:   MMM, no pharyngeal erythema. No peritonsillar mass.    Neck:   No meningismus. Full ROM. Hematological/Lymphatic/Immunilogical:   No cervical lymphadenopathy. Cardiovascular:   RRR. Symmetric bilateral radial and DP pulses.  No murmurs.  Respiratory:   Normal respiratory effort without tachypnea/retractions. Breath sounds are clear and equal bilaterally. No wheezes/rales/rhonchi. Gastrointestinal:   Soft and nontender. Non distended. There is no CVA tenderness.  No rebound, rigidity, or guarding. Genitourinary:   deferred Musculoskeletal:   Normal range of motion in all extremities. No joint effusions.  No lower extremity tenderness.  No edema. Neurologic:   Normal speech and language.  Motor grossly intact. No acute focal neurologic deficits are appreciated.  Skin:    Skin is warm, dry and  intact. No rash noted.  No petechiae, purpura, or bullae.  ____________________________________________    LABS (pertinent positives/negatives) (all labs ordered are listed, but only abnormal results are displayed) Labs Reviewed  BASIC METABOLIC PANEL - Abnormal; Notable for the following components:      Result Value   CO2 21 (*)    Glucose, Bld 139 (*)    All other components within normal limits  CBC - Abnormal; Notable for the following components:   Platelets 119 (*)    All other components within normal limits  TROPONIN I - Abnormal; Notable for the following components:   Troponin I 0.03 (*)    All other components within normal limits  TROPONIN I - Abnormal; Notable for the following components:   Troponin I 0.03 (*)    All other  components within normal limits   ____________________________________________   EKG  interpreted by me  Date: 06/11/2017  Rate: 68  Rhythm: normal sinus rhythm  QRS Axis: normal  Intervals: normal  ST/T Wave abnormalities: normal  Conduction Disutrbances: none  Narrative Interpretation: unremarkable      ____________________________________________    RADIOLOGY  Dg Chest 2 View  Result Date: 06/11/2017 CLINICAL DATA:  Chest pressure EXAM: CHEST - 2 VIEW COMPARISON:  07/01/2016 FINDINGS: The heart size and mediastinal contours are within normal limits. Both lungs are clear. The visualized skeletal structures are unremarkable. IMPRESSION: No active cardiopulmonary disease. Electronically Signed   By: Kathreen Devoid   On: 06/11/2017 11:50    ____________________________________________   PROCEDURES Procedures  ____________________________________________    CLINICAL IMPRESSION / ASSESSMENT AND PLAN / ED COURSE  Pertinent labs & imaging results that were available during my care of the patient were reviewed by me and considered in my medical decision making (see chart for details).   patient presents with central chest  pain, history of present illness concerning for angina. Presentation is not consistent with unstable angina or non-STEMI. I doubt dissection PE pericarditis or structural heart disease. Workup is negative including 2 troponins chest x-ray EKG and other labs. No evidence of pneumonia or pneumothorax on chest x-ray, EKG nondiagnostic.  Due to comorbidities, I offered the patient hospitalization for telemetry monitoring and further workup. However, he declines at this time and wants to go home. He will avoid exertion through the weekend and follow up with his cardiologist, Dr. Ubaldo Glassing, on Monday. He understands to return to the emergency room right away if he has any unremitting or worsening symptoms. He understands the risk of impending heart attack.      ____________________________________________   FINAL CLINICAL IMPRESSION(S) / ED DIAGNOSES    Final diagnoses:  Precordial pain     ED Discharge Orders    None      Portions of this note were generated with dragon dictation software. Dictation errors may occur despite best attempts at proofreading.    Carrie Mew, MD 06/11/17 1843

## 2017-06-11 NOTE — Discharge Instructions (Signed)
Your tests today were unremarkable. There is a concern that your chest pain is due to a blockage in your heart that is putting you at risk of a heart attack. If you have any unremitting or worsening symptoms, return to the emergency room right away for repeat evaluation. Take 324mg  aspirin daily until you see your cardiologist.

## 2017-06-11 NOTE — ED Triage Notes (Signed)
Chest pressure to center of chest that radiated down both arms, occurred yesterday evening and subsided. Again this AM, subsided currently. When pressure occurs, feels SOB. Pt alert and oriented X4, active, cooperative, pt in NAD. RR even and unlabored, color WNL.  No hx of similar.

## 2017-06-16 ENCOUNTER — Other Ambulatory Visit: Payer: Self-pay | Admitting: Cardiology

## 2017-06-18 ENCOUNTER — Ambulatory Visit
Admission: RE | Admit: 2017-06-18 | Discharge: 2017-06-18 | Disposition: A | Discharge status: Home / Self Care | Payer: BLUE CROSS/BLUE SHIELD | Source: Ambulatory Visit | Attending: Cardiology | Admitting: Cardiology

## 2017-06-18 ENCOUNTER — Encounter
Admission: RE | Disposition: A | Discharge status: Home / Self Care | Payer: Self-pay | Source: Ambulatory Visit | Attending: Cardiology

## 2017-06-18 DIAGNOSIS — Z87891 Personal history of nicotine dependence: Secondary | ICD-10-CM | POA: Insufficient documentation

## 2017-06-18 DIAGNOSIS — Z7984 Long term (current) use of oral hypoglycemic drugs: Secondary | ICD-10-CM | POA: Diagnosis not present

## 2017-06-18 DIAGNOSIS — Z8249 Family history of ischemic heart disease and other diseases of the circulatory system: Secondary | ICD-10-CM | POA: Insufficient documentation

## 2017-06-18 DIAGNOSIS — R079 Chest pain, unspecified: Secondary | ICD-10-CM | POA: Diagnosis present

## 2017-06-18 DIAGNOSIS — I2510 Atherosclerotic heart disease of native coronary artery without angina pectoris: Secondary | ICD-10-CM | POA: Diagnosis not present

## 2017-06-18 DIAGNOSIS — E7800 Pure hypercholesterolemia, unspecified: Secondary | ICD-10-CM | POA: Insufficient documentation

## 2017-06-18 DIAGNOSIS — I10 Essential (primary) hypertension: Secondary | ICD-10-CM | POA: Insufficient documentation

## 2017-06-18 DIAGNOSIS — E119 Type 2 diabetes mellitus without complications: Secondary | ICD-10-CM | POA: Insufficient documentation

## 2017-06-18 DIAGNOSIS — Z7982 Long term (current) use of aspirin: Secondary | ICD-10-CM | POA: Insufficient documentation

## 2017-06-18 DIAGNOSIS — G4734 Idiopathic sleep related nonobstructive alveolar hypoventilation: Secondary | ICD-10-CM | POA: Diagnosis not present

## 2017-06-18 DIAGNOSIS — M1990 Unspecified osteoarthritis, unspecified site: Secondary | ICD-10-CM | POA: Diagnosis not present

## 2017-06-18 HISTORY — PX: LEFT HEART CATH AND CORONARY ANGIOGRAPHY: CATH118249

## 2017-06-18 LAB — GLUCOSE, CAPILLARY
GLUCOSE-CAPILLARY: 141 mg/dL — AB (ref 65–99)
Glucose-Capillary: 138 mg/dL — ABNORMAL HIGH (ref 65–99)

## 2017-06-18 SURGERY — LEFT HEART CATH AND CORONARY ANGIOGRAPHY
Anesthesia: Moderate Sedation | Laterality: Left

## 2017-06-18 MED ORDER — HEPARIN (PORCINE) IN NACL 2-0.9 UNIT/ML-% IJ SOLN
INTRAMUSCULAR | Status: AC
  Filled 2017-06-18: qty 500

## 2017-06-18 MED ORDER — ASPIRIN 81 MG PO CHEW
CHEWABLE_TABLET | ORAL | Status: AC
  Administered 2017-06-18: 81 mg via ORAL
  Filled 2017-06-18: qty 1

## 2017-06-18 MED ORDER — SODIUM CHLORIDE 0.9% FLUSH: 3.0000 mL | INTRAVENOUS | Status: DC | PRN

## 2017-06-18 MED ORDER — FENTANYL CITRATE (PF) 100 MCG/2ML IJ SOLN
INTRAMUSCULAR | Status: AC
  Filled 2017-06-18: qty 2

## 2017-06-18 MED ORDER — SODIUM CHLORIDE 0.9 % IV SOLN: 250.0000 mL | INTRAVENOUS | Status: DC | PRN

## 2017-06-18 MED ORDER — SODIUM CHLORIDE 0.9 % WEIGHT BASED INFUSION
3.0000 mL/kg/h | INTRAVENOUS | Status: AC
  Administered 2017-06-18: 3 mL/kg/h via INTRAVENOUS

## 2017-06-18 MED ORDER — METFORMIN HCL 500 MG PO TABS: 500.0000 mg | ORAL_TABLET | Freq: Two times a day (BID) | ORAL | Status: DC

## 2017-06-18 MED ORDER — ASPIRIN 81 MG PO CHEW
81.0000 mg | CHEWABLE_TABLET | ORAL | Status: AC
  Administered 2017-06-18: 81 mg via ORAL

## 2017-06-18 MED ORDER — MIDAZOLAM HCL 2 MG/2ML IJ SOLN
INTRAMUSCULAR | Status: AC
  Filled 2017-06-18: qty 2

## 2017-06-18 MED ORDER — SODIUM CHLORIDE 0.9% FLUSH: 3.0000 mL | Freq: Two times a day (BID) | INTRAVENOUS | Status: DC

## 2017-06-18 MED ORDER — IOPAMIDOL (ISOVUE-300) INJECTION 61%
INTRAVENOUS | Status: DC | PRN
  Administered 2017-06-18: 115 mL via INTRAVENOUS

## 2017-06-18 MED ORDER — FENTANYL CITRATE (PF) 100 MCG/2ML IJ SOLN
INTRAMUSCULAR | Status: DC | PRN
  Administered 2017-06-18: 25 ug via INTRAVENOUS

## 2017-06-18 MED ORDER — SODIUM CHLORIDE 0.9 % WEIGHT BASED INFUSION: 1.0000 mL/kg/h | INTRAVENOUS | Status: DC

## 2017-06-18 MED ORDER — MIDAZOLAM HCL 2 MG/2ML IJ SOLN
INTRAMUSCULAR | Status: DC | PRN
  Administered 2017-06-18: 1 mg via INTRAVENOUS

## 2017-06-18 MED ORDER — METFORMIN HCL 500 MG PO TABS
500.0000 mg | ORAL_TABLET | Freq: Two times a day (BID) | ORAL | Status: DC
  Filled 2017-06-18 (×3): qty 1

## 2017-06-18 SURGICAL SUPPLY — 10 items
CATH INFINITI 5FR ANG PIGTAIL (CATHETERS) ×2 IMPLANT
CATH INFINITI 5FR JL4 (CATHETERS) ×2 IMPLANT
CATH INFINITI JR4 5F (CATHETERS) ×2 IMPLANT
DEVICE CLOSURE MYNXGRIP 5F (Vascular Products) ×2 IMPLANT
KIT MANI 3VAL PERCEP (MISCELLANEOUS) ×2 IMPLANT
NEEDLE PERC 18GX7CM (NEEDLE) ×2 IMPLANT
PACK CARDIAC CATH (CUSTOM PROCEDURE TRAY) ×2 IMPLANT
SHEATH AVANTI 5FR X 11CM (SHEATH) ×2 IMPLANT
SHEATH PINNACLE 5F 10CM (SHEATH) IMPLANT
WIRE GUIDERIGHT .035X150 (WIRE) ×2 IMPLANT

## 2017-06-18 NOTE — H&P (Signed)
Chief Complaint: Chief Complaint  Patient presents with  . er follow up  concerned about heart block  . Chest Pain  everytime he does any work  Date of Service: 06/15/2017 Date of Birth: 04-Jun-1950 PCP: Loa Socks., MD  History of Present Illness: Mr. Phillip Le is a 67 y.o.male patient who no prior cardiac history with a negative functional study in 2015. Has multiple risk factors for coronary disease including diabetes, hypertension, hyperlipidemia, family history. He presented to the emergency room after multiple episodes of exertional chest pain radiating down his arms. Evaluation emergency room showed troponins x2 normal. Echocardiogram was normal. Patient was pain-free at rest and deferred admission. He has had no physical activity since discharge and has had no further chest pain. He has no rest pain. He is not compliant with CPAP due to problems with it. Elective cardiogram reveals sinus rhythm with no ischemia. Patient has reproducible exertional chest pain consistent with possible ischemia. Will need to risk stratify for consideration for further medical versus invasive workup. We will add sublingual nitroglycerin to his regimen. We will continue with metoprolol.  Past Medical and Surgical History  Past Medical History Past Medical History:  Diagnosis Date  . Allergic state  Seasonal  . Arthritis  . Diabetes mellitus type 2, uncomplicated (CMS-HCC)  . Elevated transaminase level  and fatty liver disease  . Hemorrhoid  internal  . Hyperlipidemia, unspecified  . Hypertension  . Polycythemia  . Sleep apnea  . Vertigo   Past Surgical History He has a past surgical history that includes Appendectomy; Hemorroidectomy; egd (02/21/2007); Colonoscopy (04/05/2007, 06/29/1997); and Colonoscopy (11/30/2016).   Medications and Allergies  Current Medications  Current Outpatient Medications  Medication Sig Dispense Refill  . aspirin 81 mg tablet 1 tab by mouth daily  (Patient taking differently: Take 324 tablets by mouth once daily )  . blood glucose diagnostic (ONETOUCH ULTRA TEST) test strip Use 2 (two) times daily Use as instructed. 100 each 2  . ketoconazole (NIZORAL) 2 % cream Apply topically once daily. 30 g 3  . lancets  . metFORMIN (GLUCOPHAGE) 500 MG tablet Take 1 tablet (500 mg total) by mouth 2 (two) times daily with meals 180 tablet 3  . metoprolol tartrate (LOPRESSOR) 25 MG tablet TAKE 1 TABLET TWICE A DAY 180 tablet 1  . pravastatin (PRAVACHOL) 10 MG tablet TAKE 1 TABLET DAILY 90 tablet 2  . ramipril (ALTACE) 10 MG capsule TAKE 1 CAPSULE DAILY FOR BLOOD PRESSURE 90 capsule 3  . nitroGLYcerin (NITRODUR) 0.4 mg/hr patch Place 1 patch onto the skin once daily Leave patch(es) on for 12-14 hours, then remove for 10-12 hours prior to applying the next patch. 30 patch 11   No current facility-administered medications for this visit.   Allergies: Patient has no known allergies.  Social and Family History  Social History reports that he has quit smoking. He has never used smokeless tobacco. He reports that he does not drink alcohol or use drugs.  Family History Family History  Problem Relation Age of Onset  . Myocardial Infarction (Heart attack) Mother  . Throat cancer Father   Review of Systems  Review of Systems  Constitutional: Negative for chills, diaphoresis, fever, malaise/fatigue and weight loss.  HENT: Negative for congestion, ear discharge, hearing loss and tinnitus.  Eyes: Negative for blurred vision.  Respiratory: Negative for cough, hemoptysis, sputum production, shortness of breath and wheezing.  Cardiovascular: Positive for chest pain. Negative for palpitations, orthopnea, claudication, leg swelling and PND.  Gastrointestinal: Negative for abdominal pain, blood in stool, constipation, diarrhea, heartburn, melena, nausea and vomiting.  Genitourinary: Negative for dysuria, frequency, hematuria and urgency.  Musculoskeletal:  Negative for back pain, falls, joint pain and myalgias.  Skin: Negative for itching and rash.  Neurological: Negative for dizziness, tingling, focal weakness, loss of consciousness, weakness and headaches.  Endo/Heme/Allergies: Negative for polydipsia. Does not bruise/bleed easily.  Psychiatric/Behavioral: Negative for depression, memory loss and substance abuse. The patient is not nervous/anxious.   Physical Examination   Vitals:BP 130/84  Pulse 72  Resp 12  Ht 175.3 cm (5\' 9" )  Wt 94.8 kg (209 lb)  BMI 30.86 kg/m  Ht:175.3 cm (5\' 9" ) Wt:94.8 kg (209 lb) EHU:DJSH surface area is 2.15 meters squared. Body mass index is 30.86 kg/m.  Wt Readings from Last 3 Encounters:  06/15/17 94.8 kg (209 lb)  04/29/17 92.1 kg (203 lb)  11/19/16 92.1 kg (203 lb)   BP Readings from Last 3 Encounters:  06/15/17 130/84  04/29/17 122/82  11/19/16 118/70   General appearance appears in no acute distress  Head Mouth and Eye exam Normocephalic, without obvious abnormality, atraumatic Dentition is good Eyes appear anicteric   Neck exam Thyroid: normal  Nodes: no obvious adenopathy  LUNGS Breath Sounds: Normal Percussion: Normal  CARDIOVASCULAR JVP CV wave: no HJR: no Elevation at 90 degrees: None Carotid Pulse: normal pulsation bilaterally Bruit: None Apex: apical impulse normal  Auscultation Rhythm: normal sinus rhythm S1: normal S2: normal Clicks: no Rub: no Murmurs: no murmurs  Gallop: None ABDOMEN Liver enlargement: no Pulsatile aorta: no Ascites: no Bruits: no  EXTREMITIES Clubbing: no Edema: trace to 1+ bilateral pedal edema Pulses: peripheral pulses symmetrical Femoral Bruits: no Amputation: no SKIN Rash: no Cyanosis: no Embolic phemonenon: no Bruising: no NEURO Alert and Oriented to person, place and time: yes Non focal: yes  PSYCH: Pt appears to have normal affect  LABS REVIEWED Last 3 CBC results: Lab Results  Component Value Date  WBC 7.4  08/07/2014  WBC 7.3 11/26/2009  WBC 9.1 09/15/2008   Lab Results  Component Value Date  HGB 16.4 08/07/2014  HGB 16.5 11/26/2009  HGB 16.5 09/15/2008   Lab Results  Component Value Date  HCT 46.3 08/07/2014  HCT 0.46 11/26/2009  HCT 0.46 09/15/2008   Lab Results  Component Value Date  PLT 102 (L) 08/07/2014  PLT 116 (L) 11/26/2009  PLT 122 (L) 09/15/2008   Lab Results  Component Value Date  CREATININE 0.6 (L) 04/30/2017  BUN 21 04/30/2017  NA 138 04/30/2017  K 4.4 04/30/2017  CL 101 04/30/2017  CO2 29.6 04/30/2017   Lab Results  Component Value Date  HGBA1C 6.3 (H) 04/30/2017   Lab Results  Component Value Date  HDL 41.5 04/30/2017  HDL 37.9 11/20/2016  HDL 39.8 08/13/2016   Lab Results  Component Value Date  LDLCALC 83 04/30/2017  LDLCALC 81 11/20/2016  LDLCALC 93 08/13/2016   Lab Results  Component Value Date  TRIG 105 04/30/2017  TRIG 216 (H) 11/20/2016  TRIG 130 08/13/2016   Lab Results  Component Value Date  ALT 37 04/30/2017  AST 17 04/30/2017  ALKPHOS 47 04/30/2017   Lab Results  Component Value Date  TSH 2.37 09/15/2008   Diagnostic Studies Reviewed:  EKG EKG demonstrated normal sinus rhythm, nonspecific ST and T waves changes.  Assessment and Plan   67 y.o. male with  ICD-10-CM ICD-9-CM  1. Chest pain on exertion, unspecified-patient with exertional chest pain with unremarkable resting EKG. Will  need to evaluate for evidence of exercise-induced ischemia. We will proceed with an ETT sestamibi given strong risk factors for coronary disease including diabetes hypertension hyperlipidemia and family history. Further recommendations after studies complete. This will be a symptom limited study. R07.9 786.50 NM myocardial perfusion SPECT multiple (stress and rest)  ECG stress test only  2. Essential hypertension-continue with lisinopril and low-sodium diet. Blood pressure goal 130/80. I10 401.9  3. Pure hypercholesterolemia-patient with  chest pain with exertion. LDL is 83. Continue the pravastatin for now. LDL goal less than 100 near her 71. E78.00 272.0  4. Idiopathic sleep related nonobstructive alveolar hypoventilation weight loss and CPAP G47.34 327.24   Return in about 2 weeks (around 06/29/2017).  These notes generated with voice recognition software. I apologize for typographical errors.  Sydnee Levans, MD     Pt seen and examined. No change from above.

## 2017-06-21 ENCOUNTER — Other Ambulatory Visit: Payer: Self-pay | Admitting: *Deleted

## 2017-06-21 ENCOUNTER — Encounter: Payer: Self-pay | Admitting: Cardiothoracic Surgery

## 2017-06-21 ENCOUNTER — Other Ambulatory Visit: Payer: Self-pay

## 2017-06-21 ENCOUNTER — Institutional Professional Consult (permissible substitution): Payer: BLUE CROSS/BLUE SHIELD | Admitting: Cardiothoracic Surgery

## 2017-06-21 VITALS — BP 134/84 | HR 78 | Resp 20 | Ht 69.0 in | Wt 209.8 lb

## 2017-06-21 DIAGNOSIS — I2510 Atherosclerotic heart disease of native coronary artery without angina pectoris: Secondary | ICD-10-CM

## 2017-06-21 DIAGNOSIS — I25119 Atherosclerotic heart disease of native coronary artery with unspecified angina pectoris: Secondary | ICD-10-CM | POA: Diagnosis not present

## 2017-06-21 DIAGNOSIS — E108 Type 1 diabetes mellitus with unspecified complications: Secondary | ICD-10-CM

## 2017-06-21 NOTE — Progress Notes (Signed)
[address]       [place] [address]             [id number]                    A[facility] C Grout [facility] Medical Record #[id number] Date of Birth: [birth year]  Referring: Teodoro Spray, MD Primary Care: Sofie Hartigan, MD Primary Cardiologist: Teodoro Spray, MD  Chief Complaint:    Chief Complaint  Patient presents with  . Coronary Artery Disease    Surgical eval, Cardiac Cath     History of Present Illness:    Phillip Le 67 y.o. male is seen in the office  today for evaluation of possible coronary artery bypass grafting.  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   The following day he went to the [facility] emergency room because of recurrence of similar discomfort but ultimately was discharged home.  He not had any previous episodes of similar pain and had not had rest chest pain occur.  He was seen by Dr. Ubaldo Glassing and a nuclear stress test was performed noting ischemia.  Cardiac catheterization was done 3 days ago,  Demonstrated three-vessel coronary artery disease  Patient has a history of diabetes being treated for the past 3-4 years, his hemoglobin A1c has been as high as 9 but most recently 6.3.  He is currently on metformin.   Current Activity/ Functional Status:  Patient is independent with mobility/ambulation, transfers, ADL's, IADL's.   Zubrod Score: At the time of surgery this patient's most appropriate activity status/level should be described as: []     0    Normal activity, no symptoms [x]     1    Restricted in physical strenuous activity but ambulatory, able to do out light work []     2    Ambulatory and capable of self care, unable to do work activities, up and about               >50 % of waking hours                              []     3    Only limited self care, in bed greater than 50% of waking hours []     4    Completely disabled, no self  care, confined to bed or chair []     5    Moribund   Past Medical History:  Diagnosis Date  . Allergy   . Diabetes mellitus without complication ([place])   . Elevated lipids   . Gout   . Hypertension   . Sleep apnea     Past Surgical History:  Procedure Laterality Date  . ADENOIDECTOMY    . APPENDECTOMY    . COLONOSCOPY WITH PROPOFOL N/A 11/30/2016   Procedure: COLONOSCOPY WITH PROPOFOL;  Surgeon: Manya Silvas, MD;  Location: [facility] ENDOSCOPY;  Service: Endoscopy;  Laterality: N/A;  . LEFT HEART CATH AND CORONARY ANGIOGRAPHY Left 06/18/2017   Procedure: LEFT HEART CATH AND CORONARY ANGIOGRAPHY;  Surgeon: Teodoro Spray, MD;  Location: [facility] CV LAB;  Service: Cardiovascular;  Laterality: Left;  . TONSILLECTOMY      Family History  Problem Relation Age of Onset  . CAD Mother   . Cancer Father   . Myocardial Infarction (  Heart attack) Mother  . Throat cancer Father   Patient's father died at age 77 of malignancy patient notes that he had been an alcoholic for many years Patient's mother died at age 40 of cardiac disease, he has 3 sisters and 2 brothers alive and well   Social History   Socioeconomic History  . Marital status: Married    Spouse name: Not on file  Occupational History  .  Patient is currently retired but remains active caring for horses   Tobacco Use  . Smoking status: Former Smoker    Last attempt to quit: 06/19/1978    Years since quitting: 39.0  . Smokeless tobacco: Never Used  Substance and Sexual Activity  . Alcohol use: No  . Drug use: No  . Sexual activity: Not on file  Other Topics Concern  . Not on file    Social History   Tobacco Use  Smoking Status Former Smoker  . Last attempt to quit: 06/19/1978  . Years since quitting: 39.0  Smokeless Tobacco Never Used    Social History   Substance and Sexual Activity  Alcohol Use No     No Known Allergies  Current Outpatient Medications  Medication Sig Dispense Refill  .  albuterol (PROVENTIL HFA;VENTOLIN HFA) 108 (90 Base) MCG/ACT inhaler Inhale 1-2 puffs into the lungs every 6 (six) hours as needed for wheezing. 1 Inhaler 0  . aspirin (ASPIR-LOW) 81 MG EC tablet Take 81 mg by mouth daily.     . benzonatate (TESSALON) 200 MG capsule Take 1 capsule (200 mg total) by mouth 3 (three) times daily as needed. (Patient not taking: Reported on 06/21/2017) 30 capsule 0  . ibuprofen (ADVIL,MOTRIN) 200 MG tablet Take 400 mg by mouth every 8 (eight) hours as needed for headache or moderate pain.     Marland Kitchen ketoconazole (NIZORAL) 2 % cream Apply 1 application topically daily as needed for irritation.     . metFORMIN (GLUCOPHAGE) 500 MG tablet Take 500 mg by mouth 2 (two) times daily with a meal.     . metoprolol tartrate (LOPRESSOR) 25 MG tablet Take 25 mg by mouth 2 (two) times daily.    . nitroGLYCERIN (NITRODUR - DOSED IN MG/24 HR) 0.4 mg/hr patch Place 0.4 mg onto the skin daily.    . pravastatin (PRAVACHOL) 10 MG tablet Take 10 mg by mouth daily.    . ramipril (ALTACE) 10 MG capsule Take 10 mg by mouth daily.      No current facility-administered medications for this visit.     Pertinent items are noted in HPI.   Review of Systems:     Cardiac Review of Systems: [Y] = yes  or   [  ] = no   Chest Pain [  y  ]  Resting SOB [ n  ] Exertional SOB  Blue.Reese  ]  Orthopnea [ n ]   Pedal Edema [n   ]    Palpitations Blue.Reese  ] Syncope  [ n ]   Presyncope [n   ]  General Review of Systems: [Y] = yes [  ]=no Constitional: recent weight change [ n ];  Wt loss over the last 3 months [   ] anorexia [  ]; fatigue [ y ]; nausea [ n ]; night sweats [n  ]; fever [ n ]; or chills [ n ];          Dental: poor dentition[n  ]; Last Dentist visit:   Eye : blurred  vision [  ]; diplopia [   ]; vision changes [  ];  Amaurosis fugax[  ]; Resp: cough [n  ];  wheezing[n  ];  hemoptysis[ n ]; shortness of breath[ y ]; paroxysmal nocturnal dyspnea[n  ]; dyspnea on exertion[y  ]; or orthopnea[n  ];  GI:   gallstones[n  ], vomiting[n  ];  dysphagia[ n ]; melena[  n];  hematochezia [ n ]; heartburn[ n ];   Hx of  Colonoscopy[last year  ]; GU: kidney stones [n  ]; hematuria[n  ];   dysuria [ n ];  nocturia[n  ];  history of     obstruction [  ]; urinary frequency [  ]             Skin: rash, swelling[  ];, hair loss[  ];  peripheral edema[  ];  or itching[  ]; Musculosketetal: myalgias[n  ];  joint swelling[n  ];  joint erythema[  ];  joint pain[ y ];  back pain[  ];  Heme/Lymph: bruising[ n ];  bleeding[ n ];  anemia[ n ];  Neuro: TIA[ n ];  headachesn[ n;  stroke[ n ];  vertigo[n  ];  seizures[ n ];   paresthesias[  ];  difficulty walking[  ];  Psych:depression[n  ]; anxiety[n  ];  Endocrine: diabetes[  y];  thyroid dysfunction[  n];  Immunizations: Flu up to date Blue.Reese  ]; Pneumococcal up to date [ y ];  Other:  Physical Exam: BP 134/84 (BP Location: Left Arm, Patient Position: Sitting, Cuff Size: Large)   Pulse 78   Resp 20   Ht 5\' 9"  (1.753 m)   Wt 209 lb 12.8 oz (95.2 kg)   SpO2 98% Comment: RA  BMI 30.98 kg/m   PHYSICAL EXAMINATION: General appearance: alert, cooperative, appears stated age and no distress Head: Normocephalic, without obvious abnormality, atraumatic Neck: no adenopathy, no carotid bruit, no JVD, supple, symmetrical, trachea midline and thyroid not enlarged, symmetric, no tenderness/mass/nodules Lymph nodes: Cervical, supraclavicular, and axillary nodes normal. Resp: clear to auscultation bilaterally Back: symmetric, no curvature. ROM normal. No CVA tenderness. Cardio: regular rate and rhythm, S1, S2 normal, no murmur, click, rub or gallop GI: soft, non-tender; bowel sounds normal; no masses,  no organomegaly Extremities: extremities normal, atraumatic, no cyanosis or edema Neurologic: Grossly normal Patient appears to have adequate vein for bypass in both lower extremities has palpable DP and PT pulses bilaterally Dressing is on the right groin without palpable  femoral false aneurysm he does have bruising along the inner aspect of his right thigh  Diagnostic Studies & Laboratory data:     Recent Radiology Findings:   Dg Chest 2 View  Result Date: 06/11/2017 CLINICAL DATA:  Chest pressure EXAM: CHEST - 2 VIEW COMPARISON:  07/01/2016 FINDINGS: The heart size and mediastinal contours are within normal limits. Both lungs are clear. The visualized skeletal structures are unremarkable. IMPRESSION: No active cardiopulmonary disease. Electronically Signed   By: Kathreen Devoid   On: 06/11/2017 11:50     I have independently reviewed the above radiologic studies.  Recent Lab Findings: Lab Results  Component Value Date   WBC 7.1 06/11/2017   HGB 16.9 06/11/2017   HCT 49.0 06/11/2017   PLT 119 (L) 06/11/2017   GLUCOSE 139 (H) 06/11/2017   NA 137 06/11/2017   K 4.2 06/11/2017   CL 106 06/11/2017   CREATININE 0.65 06/11/2017   BUN 20 06/11/2017   CO2 21 (L) 06/11/2017   CATH:  Procedures   LEFT HEART CATH AND CORONARY ANGIOGRAPHY  Conclusion     Dist LM lesion is 60% stenosed.  Ost LAD lesion is 75% stenosed.  Ost 1st Diag lesion is 99% stenosed.  Ost Cx lesion is 70% stenosed.  Prox LAD lesion is 70% stenosed.  Mid RCA lesion is 60% stenosed.  The left ventricular systolic function is normal.  LV end diastolic pressure is normal.  There is no aortic valve stenosis.  There is no mitral valve stenosis and no mitral valve prolapse evident.   LM 60% stenosis LAD 75% ostial stenosis, 70% mid D1 99% ostial stenosis LCx 70% ostial Stenosis RCA-60% mid stenosis  LV-normal ef.  Referral to cardiothoracic surgery for consideration for cabg.      I have independently reviewed the above  cath films and reviewed the findings with the  patient .     Indications   Chest pain, unspecified type [R07.9 (ICD-10-CM)]   [place] [address] [place]  [place]  Procedure: Exercise Myocardial Perfusion Imaging ONE day procedure  Indication: Chest pain on exertion, unspecified Plan: NM myocardial perfusion SPECT multiple (stress  and rest), ECG stress test only  Ordering Physician:   Dr. Bartholome Bill   Clinical History: 67 y.o. year old male Vitals: Height: 9 inWeight: 209 lb Cardiac risk factors include:  Hyperlipidemia, Diabetes and HTN    Procedure: The patient performed treadmill exercise using a Bruce protocol for 6:24  minutes. The exercise test was stopped due to fatigue.Blood pressure  response was normal. The patient developed symptoms other than fatigue  during the procedure; specific symptoms included SEVERE FATIGUE AND SOB  WITH CHEST PAIN AND LEG STIFFNESS.  Rest HR: 58bpm Rest BP: 126/23mmHg Max HR: 125bpm Max BP: 150/70mmHg Mets: 7.60 % MAX HR: 80%  Stress Test Administered by: Oswald Hillock, CMA  ECG Interpretation: Rest JSE:GBTDVV sinus rhythm, none Stress OHY:WVPXT tachycardia, 1-2 mm of ST depression inferiorly with  stress. Recovery GGY:IRSWNI sinus rhythm ECG Interpretation:positive, ECG evidence of ischemia.   Administrations This Visit  technetium Tc74m sestamibi (CARDIOLITE) injection 62.70 millicurie  Admin Date 35/00/9381 Action Given Dose 82.99 millicurie Route Intravenous Administered By Ane Payment, CNMT     technetium Tc23m sestamibi (CARDIOLITE) injection 37.16 millicurie  Admin Date 96/78/9381 Action Given Dose 01.75 millicurie Route Intravenous Administered By Ane Payment, CNMT    Assessment / Plan:   1/Three-vessel coronary artery disease with a component of left main obstruction with new onset of angina and positive stress test and diabetes.  With this combination of features I recommended to the patient that we proceed with coronary artery bypass grafting.  Risks and options have been discussed with the patient  in detail and he wishes to proceed as soon as possible.  Tentatively make arrangements for preop laboratories and echocardiogram tomorrow and surgery on Wednesday.  2/diabetes with complication of coronary occlusive disease with recent hemoglobin A1c 6.3   The goals risks and alternatives of the planned surgical procedure CABG  have been discussed with the patient in detail. The risks of the procedure including death, infection, stroke, myocardial infarction, bleeding, blood transfusion have all been discussed specifically.  I have quoted Phillip Le a 2 % of perioperative mortality and a complication rate as high as 30 %. The patient's questions have been answered.Phillip Le is willing  to proceed with the planned procedure. Grace Isaac MD      [phone]  [address] [place] [address] [place]   Beeper [phone]  06/21/2017 5:36 PM

## 2017-06-21 NOTE — Patient Instructions (Signed)
 Coronary Artery Bypass Grafting Coronary artery bypass grafting (CABG) is a procedure to bypass or fix arteries of the heart (coronary arteries) that have become narrow or blocked. This narrowing is usually the result of a buildup of fatty deposits (plaques) in the walls of the vessels. The coronary arteries supply the heart with the oxygen and nutrients that it needs to pump blood to your body. In this procedure, a section of blood vessel from another part of the body (usually the chest, arm, or leg) is removed (harvested) and then inserted where it will allow blood to bypass the damaged part of the coronary artery. The harvested section of blood vessel is called the graft. Tell a health care provider about:  Any allergies you have.  All medicines you are taking or using, including steroids, blood thinners, vitamins, herbs, eye drops, creams, and over-the-counter medicines.  Any problems you or family members have had with anesthetic medicines.  Any blood disorders you have.  Any surgeries you have had.  Any medical conditions you have.  Whether you are pregnant or may be pregnant. What are the risks? Generally, this is a safe procedure. However, problems may occur, including:  Bleeding, which may require transfusions.  Infection.  Short term memory loss, confusion, and personality changes (cognitive dysfunction).  Pain at the surgical site.  Damage to other structures or organs.  Stroke.  Allergic reactions to medicines or dyes.  Heart attack during or after surgery.  Kidney failure.  There may be additional risks and complications depending on where the veins are harvested from in your body for the procedure. What happens before the procedure? Staying hydrated Follow instructions from your health care provider about hydration, which may include:  Up to 2 hours before the procedure - you may continue to drink clear liquids, such as water, clear fruit juice, black  coffee, and plain tea.  Eating and drinking restrictions Follow instructions from your health care provider about eating and drinking, which may include:  8 hours before the procedure - stop eating heavy meals or foods such as meat, fried foods, or fatty foods.  6 hours before the procedure - stop eating light meals or foods, such as toast or cereal.  6 hours before the procedure - stop drinking milk or drinks that contain milk.  2 hours before the procedure - stop drinking clear liquids.  Medicine  Take over-the-counter and prescription medicines only as told by your health care provider.  Ask your health care provider about: ? Changing or stopping your regular medicines. This is especially important if you are taking diabetes medicines or blood thinners. You may be asked to start new medicines and stop taking others. Do not stop medicines or adjust dosages on your own. ? Taking medicines such as aspirin and ibuprofen. These medicines can thin your blood. Do not take these medicines before your procedure if your health care provider instructs you not to.  You may be given antibiotic medicine to help prevent infection. General instructions  Ask your health care provider how your surgical site will be marked or identified.  You may be asked to shower with a germ-killing soap.  For 3-6 weeks before the procedure, try not to use any products that contain nicotine or tobacco, such as cigarettes and e-cigarettes. Quitting smoking is one of the best things you can do for your heart health. If you need help quitting, ask your health care provider.  Talk with your health care provider about where graft   will come from. What happens during the procedure?  To reduce your risk of infection: ? Your health care team will wash or sanitize their hands. ? Your skin will be washed with soap. ? Hair may be removed from the surgical area.  An IV tube will be inserted into one of your veins.  You  will be given one or more of the following: ? A medicine to help you relax (sedative). ? A medicine to make you fall asleep (general anesthetic).  A cut (incision) will be made down the front of the chest through the breastbone (sternum). The sternum will be spread open so your heart can be seen.  You may be placed on a heart-lung bypass machine. This machine will provide oxygen to your blood while the heart is undergoing surgery. Your surgeon may be able to do the surgery without the heart-lung bypass machine. That is called beating heart bypass surgery.  If a heart-lung bypass machine is needed, your heart will be temporarily stopped.  A section of blood vessel will be harvested from another part of your body (usually the chest, arm, or leg) and used to bypass the blocked arteries of your heart.  When the bypass is done, you will be taken off the heart-lung machine if it was used.  If your heart was stopped, it will be restarted and will take over again normally.  Your chest will be closed.  A bandage (dressing) will be placed over the incisions.  Tubes will remain in your chest and will be connected to a suction device to help drain fluid and reinflate the lungs. The procedure may vary among health care providers and hospitals. What happens after the procedure?  Your blood pressure, heart rate, breathing rate, and blood oxygen level will be monitored until the medicines you were given have worn off.  You may wake up with a tube in your throat to help your breathing. You may be connected to a breathing machine. You will not be able to talk while the tube is in place. The tube will be taken out as soon as it is safe.  You will be groggy and may have some pain. You will be given pain medicine to help control the pain.  You will be shown how to do deep breathing exercises. Summary  In this procedure, a section of blood vessel from another part of the body (usually the chest, arm, or  leg) is removed (harvested) and then inserted where it will allow blood to bypass the damaged part of the coronary artery. The harvested section of blood vessel is called the graft.  For 3-6 weeks before the procedure, try not to use any products that contain nicotine or tobacco, such as cigarettes and e-cigarettes. Quitting smoking is one of the best things you can do for your heart health. If you need help quitting, ask your health care provider.  You may be placed on a heart-lung bypass machine during the surgery. This machine will provide oxygen to your blood while the heart is undergoing surgery. Your surgeon may be able to do the surgery without the heart-lung bypass machine. That is called beating heart bypass surgery.  You may wake up with a tube in your throat to help your breathing. You may be connected to a breathing machine. You will not be able to talk while the tube is in place. The tube will be taken out as soon as it is safe. This information is not intended to replace   advice given to you by your health care provider. Make sure you discuss any questions you have with your health care provider. Document Released: 12/31/2004 Document Revised: 02/10/2016 Document Reviewed: 02/10/2016 Elsevier Interactive Patient Education  2018 [employer]   Coronary Artery Bypass Grafting, Care After This sheet gives you information about how to care for yourself after your procedure. Your health care provider may also give you more specific instructions. If you have problems or questions, contact your health care provider. What can I expect after the procedure? After the procedure, it is common to have:  Nausea and a lack of appetite.  Constipation.  Weakness and fatigue.  Depression or irritability.  Pain or discomfort in your incision areas.  Follow these instructions at home: Medicines  Take over-the-counter and prescription medicines only as told by your health care provider. Do  not stop taking medicines or start any new medicines without approval from your health care provider.  If you were prescribed an antibiotic medicine, take it as told by your health care provider. Do not stop taking the antibiotic even if you start to feel better.  Do not drive or use heavy machinery while taking prescription pain medicine. Incision care  Follow instructions from your health care provider about how to take care of your incisions. Make sure you: ? Wash your hands with soap and water before you change your bandage (dressing). If soap and water are not available, use hand sanitizer. ? Change your dressing as told by your health care provider. ? Leave stitches (sutures), skin glue, or adhesive strips in place. These skin closures may need to stay in place for 2 weeks or longer. If adhesive strip edges start to loosen and curl up, you may trim the loose edges. Do not remove adhesive strips completely unless your health care provider tells you to do that.  Keep incision areas clean, dry, and protected.  Check your incision areas every day for signs of infection. Check for: ? More redness, swelling, or pain. ? More fluid or blood. ? Warmth. ? Pus or a bad smell.  If incisions were made in your legs: ? Avoid crossing your legs. ? Avoid sitting for long periods of time. Change positions every 30 minutes. ? Raise (elevate) your legs when you are sitting. Bathing  Do not take baths, swim, or use a hot tub until your health care provider approves.  Only take sponge baths. Pat the incisions dry. Do not rub incisions with a washcloth or towel.  Ask your health care provider when you can shower. Eating and drinking  Eat foods that are high in fiber, such as raw fruits and vegetables, whole grains, beans, and nuts. Meats should be lean cut. Avoid canned, processed, and fried foods. This can help prevent constipation and is a recommended part of a heart-healthy diet.  Drink enough  fluid to keep your urine clear or pale yellow.  Limit alcohol intake to no more than 1 drink a day for nonpregnant women and 2 drinks a day for men. One drink equals 12 oz of beer, 5 oz of wine, or 1 oz of hard liquor. Activity  Rest and limit your activity as told by your health care provider. You may be instructed to: ? Stop any activity right away if you have chest pain, shortness of breath, irregular heartbeats, or dizziness. Get help right away if you have any of these symptoms. ? Move around frequently for short periods or take short walks as directed by   your health care provider. Gradually increase your activities. You may need physical therapy or cardiac rehabilitation to help strengthen your muscles and build your endurance. ? Avoid lifting, pushing, or pulling anything that is heavier than 10 lb (4.5 kg) for at least 6 weeks or as told by your health care provider.  Do not drive until your health care provider approves.  Ask your health care provider when you may return to work.  Ask your health care provider when you may resume sexual activity. General instructions  Do not use any products that contain nicotine or tobacco, such as cigarettes and e-cigarettes. If you need help quitting, ask your health care provider.  Take 2-3 deep breaths every few hours during the day, while you recover. This helps expand your lungs and prevent complications like pneumonia after surgery.  If you were given a device called an incentive spirometer, use it several times a day to practice deep breathing. Support your chest with a pillow or your arms when you take deep breaths or cough.  Wear compression stockings as told by your health care provider. These stockings help to prevent blood clots and reduce swelling in your legs.  Weigh yourself every day. This helps identify if your body is holding (retaining) fluid that may make your heart and lungs work harder.  Keep all follow-up visits as told  by your health care provider. This is important. Contact a health care provider if:  You have more redness, swelling, or pain around any incision.  You have more fluid or blood coming from any incision.  Any incision feels warm to the touch.  You have pus or a bad smell coming from any incision  You have a fever.  You have swelling in your ankles or legs.  You have pain in your legs.  You gain 2 lb (0.9 kg) or more a day.  You are nauseous or you vomit.  You have diarrhea. Get help right away if:  You have chest pain that spreads to your jaw or arms.  You are short of breath.  You have a fast or irregular heartbeat.  You notice a "clicking" in your breastbone (sternum) when you move.  You have numbness or weakness in your arms or legs.  You feel dizzy or light-headed. Summary  After the procedure, it is common to have pain or discomfort in the incision areas.  Do not take baths, swim, or use a hot tub until your health care provider approves.  Gradually increase your activities. You may need physical therapy or cardiac rehabilitation to help strengthen your muscles and build your endurance.  Weigh yourself every day. This helps identify if your body is holding (retaining) fluid that may make your heart and lungs work harder. This information is not intended to replace advice given to you by your health care provider. Make sure you discuss any questions you have with your health care provider. Document Released: 10/10/2004 Document Revised: 02/10/2016 Document Reviewed: 02/10/2016 Elsevier Interactive Patient Education  2018 [employer]  

## 2017-06-22 ENCOUNTER — Encounter (HOSPITAL_COMMUNITY)
Admission: RE | Admit: 2017-06-22 | Discharge: 2017-06-22 | Disposition: A | Discharge status: Home / Self Care | Payer: BLUE CROSS/BLUE SHIELD | Source: Ambulatory Visit | Attending: Cardiothoracic Surgery | Admitting: Cardiothoracic Surgery

## 2017-06-22 ENCOUNTER — Ambulatory Visit (HOSPITAL_BASED_OUTPATIENT_CLINIC_OR_DEPARTMENT_OTHER)
Admission: RE | Admit: 2017-06-22 | Discharge: 2017-06-22 | Disposition: A | Discharge status: Home / Self Care | Payer: BLUE CROSS/BLUE SHIELD | Source: Ambulatory Visit | Attending: Cardiothoracic Surgery | Admitting: Cardiothoracic Surgery

## 2017-06-22 ENCOUNTER — Encounter (HOSPITAL_COMMUNITY): Payer: Self-pay

## 2017-06-22 ENCOUNTER — Other Ambulatory Visit: Payer: Self-pay | Admitting: *Deleted

## 2017-06-22 ENCOUNTER — Ambulatory Visit (HOSPITAL_COMMUNITY)
Admission: RE | Admit: 2017-06-22 | Discharge: 2017-06-22 | Disposition: A | Discharge status: Home / Self Care | Payer: BLUE CROSS/BLUE SHIELD | Source: Ambulatory Visit | Attending: Cardiothoracic Surgery | Admitting: Cardiothoracic Surgery

## 2017-06-22 ENCOUNTER — Other Ambulatory Visit: Payer: Self-pay

## 2017-06-22 DIAGNOSIS — Z01812 Encounter for preprocedural laboratory examination: Secondary | ICD-10-CM | POA: Insufficient documentation

## 2017-06-22 DIAGNOSIS — I2510 Atherosclerotic heart disease of native coronary artery without angina pectoris: Secondary | ICD-10-CM | POA: Diagnosis not present

## 2017-06-22 DIAGNOSIS — E119 Type 2 diabetes mellitus without complications: Secondary | ICD-10-CM

## 2017-06-22 DIAGNOSIS — Z01810 Encounter for preprocedural cardiovascular examination: Secondary | ICD-10-CM | POA: Insufficient documentation

## 2017-06-22 DIAGNOSIS — Z01818 Encounter for other preprocedural examination: Secondary | ICD-10-CM | POA: Insufficient documentation

## 2017-06-22 DIAGNOSIS — I081 Rheumatic disorders of both mitral and tricuspid valves: Secondary | ICD-10-CM

## 2017-06-22 DIAGNOSIS — I10 Essential (primary) hypertension: Secondary | ICD-10-CM | POA: Insufficient documentation

## 2017-06-22 HISTORY — DX: Cardiac arrhythmia, unspecified: I49.9

## 2017-06-22 HISTORY — DX: Atherosclerotic heart disease of native coronary artery without angina pectoris: I25.10

## 2017-06-22 HISTORY — DX: Unspecified osteoarthritis, unspecified site: M19.90

## 2017-06-22 HISTORY — DX: Personal history of urinary calculi: Z87.442

## 2017-06-22 HISTORY — DX: Presence of spectacles and contact lenses: Z97.3

## 2017-06-22 LAB — COMPREHENSIVE METABOLIC PANEL
ALT: 41 U/L (ref 17–63)
AST: 23 U/L (ref 15–41)
Albumin: 4.1 g/dL (ref 3.5–5.0)
Alkaline Phosphatase: 54 U/L (ref 38–126)
Anion gap: 10 (ref 5–15)
BUN: 19 mg/dL (ref 6–20)
CO2: 21 mmol/L — ABNORMAL LOW (ref 22–32)
Calcium: 9.4 mg/dL (ref 8.9–10.3)
Chloride: 105 mmol/L (ref 101–111)
Creatinine, Ser: 0.63 mg/dL (ref 0.61–1.24)
GFR calc Af Amer: >60 mL/min (ref >60)
GFR calc non Af Amer: >60 mL/min (ref >60)
Glucose, Bld: 211 mg/dL — ABNORMAL HIGH (ref 65–99)
Potassium: 4.2 mmol/L (ref 3.5–5.1)
Sodium: 136 mmol/L (ref 135–145)
Total Bilirubin: 1 mg/dL (ref 0.3–1.2)
Total Protein: 7 g/dL (ref 6.5–8.1)

## 2017-06-22 LAB — CBC
HCT: 44.6 % (ref 39.0–52.0)
Hemoglobin: 16.1 g/dL (ref 13.0–17.0)
MCH: 32.3 pg (ref 26.0–34.0)
MCHC: 36.1 g/dL — ABNORMAL HIGH (ref 30.0–36.0)
MCV: 89.6 fL (ref 78.0–100.0)
Platelets: 103 10*3/uL — ABNORMAL LOW (ref 150–400)
RBC: 4.98 MIL/uL (ref 4.22–5.81)
RDW: 12.9 % (ref 11.5–15.5)
WBC: 7.5 10*3/uL (ref 4.0–10.5)

## 2017-06-22 LAB — PULMONARY FUNCTION TEST
FEF 25-75 Pre: 3.9 L/sec
FEF2575-%Pred-Pre: 150 %
FEV1-%Pred-Pre: 110 %
FEV1-Pre: 3.62 L
FEV1FVC-%Pred-Pre: 108 %
FEV6-%Pred-Pre: 107 %
FEV6-Pre: 4.47 L
FEV6FVC-%Pred-Pre: 104 %
FVC-%Pred-Pre: 102 %
FVC-Pre: 4.49 L
Pre FEV1/FVC ratio: 81 %
Pre FEV6/FVC Ratio: 100 %

## 2017-06-22 LAB — BLOOD GAS, ARTERIAL
Acid-Base Excess: 0.1 mmol/L (ref 0.0–2.0)
Bicarbonate: 23.9 mmol/L (ref 20.0–28.0)
Drawn by: 470591
FIO2: 21
O2 Saturation: 97 %
Patient temperature: 98.6
pCO2 arterial: 36.9 mmHg (ref 32.0–48.0)
pH, Arterial: 7.428 (ref 7.350–7.450)
pO2, Arterial: 90.3 mmHg (ref 83.0–108.0)

## 2017-06-22 LAB — URINALYSIS, ROUTINE W REFLEX MICROSCOPIC
Bilirubin Urine: NEGATIVE
Glucose, UA: NEGATIVE mg/dL
Hgb urine dipstick: NEGATIVE
Ketones, ur: NEGATIVE mg/dL
Leukocytes, UA: NEGATIVE
Nitrite: NEGATIVE
Protein, ur: NEGATIVE mg/dL
Specific Gravity, Urine: 1.014 (ref 1.005–1.030)
pH: 5 (ref 5.0–8.0)

## 2017-06-22 LAB — PROTIME-INR
INR: 1.13
Prothrombin Time: 14.4 seconds (ref 11.4–15.2)

## 2017-06-22 LAB — TYPE AND SCREEN
ABO/RH(D): A POS
Antibody Screen: NEGATIVE

## 2017-06-22 LAB — HEMOGLOBIN A1C
Hgb A1c MFr Bld: 6.6 % — ABNORMAL HIGH (ref 4.8–5.6)
Mean Plasma Glucose: 142.72 mg/dL

## 2017-06-22 LAB — GLUCOSE, CAPILLARY: Glucose-Capillary: 119 mg/dL — ABNORMAL HIGH (ref 65–99)

## 2017-06-22 LAB — APTT: aPTT: 32 seconds (ref 24–36)

## 2017-06-22 LAB — SURGICAL PCR SCREEN
MRSA, PCR: NEGATIVE
Staphylococcus aureus: NEGATIVE

## 2017-06-22 LAB — ABO/RH: ABO/RH(D): A POS

## 2017-06-22 MED ORDER — TRANEXAMIC ACID (OHS) BOLUS VIA INFUSION
15.0000 mg/kg | INTRAVENOUS | Status: DC
  Filled 2017-06-22: qty 1428

## 2017-06-22 MED ORDER — NITROGLYCERIN IN D5W 200-5 MCG/ML-% IV SOLN
2.0000 ug/min | INTRAVENOUS | Status: DC
  Filled 2017-06-22: qty 250

## 2017-06-22 MED ORDER — MILRINONE LACTATE IN DEXTROSE 20-5 MG/100ML-% IV SOLN
0.1250 ug/kg/min | INTRAVENOUS | Status: DC
  Filled 2017-06-22: qty 100

## 2017-06-22 MED ORDER — SODIUM CHLORIDE 0.9 % IV SOLN
INTRAVENOUS | Status: AC
  Administered 2017-06-23: 1 [IU]/h via INTRAVENOUS
  Filled 2017-06-22: qty 1

## 2017-06-22 MED ORDER — DOPAMINE-DEXTROSE 3.2-5 MG/ML-% IV SOLN
0.0000 ug/kg/min | INTRAVENOUS | Status: DC
  Filled 2017-06-22: qty 250

## 2017-06-22 MED ORDER — SODIUM CHLORIDE 0.9 % IV SOLN
30.0000 ug/min | INTRAVENOUS | Status: AC
  Administered 2017-06-23: 25 ug/min via INTRAVENOUS
  Filled 2017-06-22: qty 20

## 2017-06-22 MED ORDER — PLASMA-LYTE 148 IV SOLN
INTRAVENOUS | Status: AC
  Administered 2017-06-23: 13:00:00
  Filled 2017-06-22: qty 2.5

## 2017-06-22 MED ORDER — DEXMEDETOMIDINE HCL IN NACL 400 MCG/100ML IV SOLN
0.1000 ug/kg/h | INTRAVENOUS | Status: AC
  Administered 2017-06-23: .2 ug/kg/h via INTRAVENOUS
  Filled 2017-06-22: qty 100

## 2017-06-22 MED ORDER — SODIUM CHLORIDE 0.9 % IV SOLN
1.5000 g | INTRAVENOUS | Status: AC
  Administered 2017-06-23: .75 g via INTRAVENOUS
  Administered 2017-06-23: 1.5 g via INTRAVENOUS
  Filled 2017-06-22: qty 1.5

## 2017-06-22 MED ORDER — EPINEPHRINE PF 1 MG/ML IJ SOLN
0.0000 ug/min | INTRAMUSCULAR | Status: DC
  Filled 2017-06-22: qty 4

## 2017-06-22 MED ORDER — POTASSIUM CHLORIDE 2 MEQ/ML IV SOLN
80.0000 meq | INTRAVENOUS | Status: DC
  Filled 2017-06-22: qty 40

## 2017-06-22 MED ORDER — TRANEXAMIC ACID (OHS) PUMP PRIME SOLUTION
2.0000 mg/kg | INTRAVENOUS | Status: DC
  Filled 2017-06-22: qty 1.9

## 2017-06-22 MED ORDER — CHLORHEXIDINE GLUCONATE 0.12 % MT SOLN
15.0000 mL | Freq: Once | OROMUCOSAL | Status: AC
  Administered 2017-06-23: 15 mL via OROMUCOSAL
  Filled 2017-06-22: qty 15

## 2017-06-22 MED ORDER — TRANEXAMIC ACID 1000 MG/10ML IV SOLN
1.5000 mg/kg/h | INTRAVENOUS | Status: AC
  Administered 2017-06-23: 15 mg/kg/h via INTRAVENOUS
  Filled 2017-06-22 (×2): qty 25

## 2017-06-22 MED ORDER — HEPARIN SODIUM (PORCINE) 1000 UNIT/ML IJ SOLN
INTRAMUSCULAR | Status: DC
  Filled 2017-06-22: qty 30

## 2017-06-22 MED ORDER — SODIUM CHLORIDE 0.9 % IV SOLN
750.0000 mg | INTRAVENOUS | Status: DC
  Filled 2017-06-22: qty 750

## 2017-06-22 MED ORDER — METOPROLOL TARTRATE 12.5 MG HALF TABLET: 12.5000 mg | ORAL_TABLET | Freq: Once | ORAL | Status: DC

## 2017-06-22 MED ORDER — MAGNESIUM SULFATE 50 % IJ SOLN
40.0000 meq | INTRAMUSCULAR | Status: DC
  Filled 2017-06-22: qty 9.85

## 2017-06-22 MED ORDER — VANCOMYCIN HCL 10 G IV SOLR
1500.0000 mg | INTRAVENOUS | Status: AC
  Administered 2017-06-23: 1500 mg via INTRAVENOUS
  Filled 2017-06-22: qty 1500

## 2017-06-22 NOTE — Progress Notes (Addendum)
PCP: Dr. Ellison Hughs Cardiologist: Dr. Ubaldo Glassing  EKG: 06-11-17--within 1 month CXR: 06-11-17--within 2 weeks ECHO: 06-22-17 Stress Test: 06-16-2017, care everywhere Cardiac Cath: 06-18-17, pt reports some bruising at site, but otherwise "looks good" Sleep study > than 10 years ago, does not wear CPAP Dopplers: Today PFT's: Today  Fasting Blood Sugar- between 100-130 Checks Blood Sugar once a day  Patient denies shortness of breath, fever, cough, and chest pain at PAT appointment.  Patient verbalized understanding of instructions provided today at the PAT appointment.  Patient asked to review instructions at home and day of surgery.   Called and spoke to [place] RN--verified prep orders for pt. Clip chin to toes, R/L groin, anterior/posterior legs.

## 2017-06-22 NOTE — Pre-Procedure Instructions (Signed)
THORNE WIRZ  06/22/2017      [facility], [place] - [address] [place] [address] Phone: [phone] Fax: [phone]    Your procedure is scheduled on June 23, 2017.  Report to [facility] Admitting at 06:30 A.M.  Call this number if you have problems the morning of surgery:  [phone]   Remember:  Do not eat food or drink liquids after midnight.   Take these medicines the morning of surgery with A SIP OF WATER : Metoprolol (Lopressor)  7 days prior to surgery STOP taking any Aspirin (unless otherwise instructed by your surgeon), Aleve, Naproxen, Ibuprofen, Motrin, Advil, Goody's, BC's, all herbal medications, fish oil, and all vitamins.     How to Manage Your Diabetes Before and After Surgery  Why is it important to control my blood sugar before and after surgery? . Improving blood sugar levels before and after surgery helps healing and can limit problems. . A way of improving blood sugar control is eating a healthy diet by: o  Eating less sugar and carbohydrates o  Increasing activity/exercise o  Talking with your doctor about reaching your blood sugar goals . High blood sugars (greater than 180 mg/dL) can raise your risk of infections and slow your recovery, so you will need to focus on controlling your diabetes during the weeks before surgery. . Make sure that the doctor who takes care of your diabetes knows about your planned surgery including the date and location.  How do I manage my blood sugar before surgery? . Check your blood sugar at least 4 times a day, starting 2 days before surgery, to make sure that the level is not too high or low. o Check your blood sugar the morning of your surgery when you wake up and every 2 hours until you get to the Short Stay unit. . If your blood sugar is less than 70 mg/dL, you will need to treat for low blood sugar: o Do not take insulin. o Treat a low blood  sugar (less than 70 mg/dL) with  cup of clear juice (cranberry or apple), 4 glucose tablets, OR glucose gel. Recheck blood sugar in 15 minutes after treatment (to make sure it is greater than 70 mg/dL). If your blood sugar is not greater than 70 mg/dL on recheck, call [phone] o  for further instructions. . Report your blood sugar to the short stay nurse when you get to Short Stay.  . If you are admitted to the hospital after surgery: o Your blood sugar will be checked by the staff and you will probably be given insulin after surgery (instead of oral diabetes medicines) to make sure you have good blood sugar levels. o The goal for blood sugar control after surgery is 80-180 mg/dL.      WHAT DO I DO ABOUT MY DIABETES MEDICATION?   Marland Kitchen Do not take oral diabetes medicines (pills) the morning of surgery. DO NOT TAKE METFORMIN THE MORNING OF SURGERY.     Do not wear jewelry.  Do not wear lotions, powders, or perfumes, or deodorant.  Do not shave 48 hours prior to surgery.  Men may shave face and neck.  Do not bring valuables to the hospital.   [facility] is not responsible for any belongings or valuables.  Contacts, dentures or bridgework may not be worn into surgery.  Leave your suitcase in the car.  After surgery it may be  brought to your room.  For patients admitted to the hospital, discharge time will be determined by your treatment team.  Patients discharged the day of surgery will not be allowed to drive home.   Special instructions:   [facility]- Preparing For Surgery  Before surgery, you can play an important role. Because skin is not sterile, your skin needs to be as free of germs as possible. You can reduce the number of germs on your skin by washing with CHG (chlorahexidine gluconate) Soap before surgery.  CHG is an antiseptic cleaner which kills germs and bonds with the skin to continue killing germs even after washing.  Please do not use if you have an allergy to CHG  or antibacterial soaps. If your skin becomes reddened/irritated stop using the CHG.  Do not shave (including legs and underarms) for at least 48 hours prior to first CHG shower. It is OK to shave your face.  Please follow these instructions carefully.   1. Shower the NIGHT BEFORE SURGERY and the MORNING OF SURGERY with CHG.   2. If you chose to wash your hair, wash your hair first as usual with your normal shampoo.  3. After you shampoo, rinse your hair and body thoroughly to remove the shampoo.  4. Use CHG as you would any other liquid soap. You can apply CHG directly to the skin and wash gently with a scrungie or a clean washcloth.   5. Apply the CHG Soap to your body ONLY FROM THE NECK DOWN.  Do not use on open wounds or open sores. Avoid contact with your eyes, ears, mouth and genitals (private parts). Wash Face and genitals (private parts)  with your normal soap.  6. Wash thoroughly, paying special attention to the area where your surgery will be performed.  7. Thoroughly rinse your body with warm water from the neck down.  8. DO NOT shower/wash with your normal soap after using and rinsing off the CHG Soap.  9. Pat yourself dry with a CLEAN TOWEL.  10. Wear CLEAN PAJAMAS to bed the night before surgery, wear comfortable clothes the morning of surgery  11. Place CLEAN SHEETS on your bed the night of your first shower and DO NOT SLEEP WITH PETS.    Day of Surgery: Do not apply any deodorants/lotions. Please wear clean clothes to the hospital/surgery center.      Please read over the following fact sheets that you were given.

## 2017-06-22 NOTE — Progress Notes (Signed)
Carotid duplex prelim: right 1-39% ICA stenosis, left 40-59% ICA stenosis. ABI prelim: WNL. UE Doppler prelim: right palmar normal with radial and ulnar compression, left palmar obliterates with radial compression, normal with ulnar compression. Landry Mellow, RDMS, RVT

## 2017-06-22 NOTE — Progress Notes (Signed)
Called Ryan RN at Dr. Everrett Coombe office and notified of pt's low platelet count.

## 2017-06-22 NOTE — Progress Notes (Signed)
Echocardiogram 2D Echocardiogram has been performed.  Phillip Le 06/22/2017, 12:39 PM

## 2017-06-23 ENCOUNTER — Inpatient Hospital Stay (HOSPITAL_COMMUNITY): Payer: BLUE CROSS/BLUE SHIELD | Admitting: Anesthesiology

## 2017-06-23 ENCOUNTER — Encounter (HOSPITAL_COMMUNITY)
Admission: RE | Disposition: A | Discharge status: Home / Self Care | Payer: Self-pay | Source: Home / Self Care | Attending: Cardiothoracic Surgery

## 2017-06-23 ENCOUNTER — Inpatient Hospital Stay (HOSPITAL_COMMUNITY)
Admission: RE | Admit: 2017-06-23 | Discharge: 2017-06-27 | DRG: 236 | Disposition: A | Discharge status: Home / Self Care | Payer: BLUE CROSS/BLUE SHIELD | Attending: Cardiothoracic Surgery | Admitting: Cardiothoracic Surgery

## 2017-06-23 ENCOUNTER — Inpatient Hospital Stay (HOSPITAL_COMMUNITY): Payer: BLUE CROSS/BLUE SHIELD

## 2017-06-23 ENCOUNTER — Ambulatory Visit (HOSPITAL_COMMUNITY): Payer: BLUE CROSS/BLUE SHIELD

## 2017-06-23 DIAGNOSIS — I083 Combined rheumatic disorders of mitral, aortic and tricuspid valves: Secondary | ICD-10-CM | POA: Diagnosis present

## 2017-06-23 DIAGNOSIS — R682 Dry mouth, unspecified: Secondary | ICD-10-CM | POA: Diagnosis present

## 2017-06-23 DIAGNOSIS — I480 Paroxysmal atrial fibrillation: Secondary | ICD-10-CM | POA: Diagnosis present

## 2017-06-23 DIAGNOSIS — E119 Type 2 diabetes mellitus without complications: Secondary | ICD-10-CM | POA: Diagnosis present

## 2017-06-23 DIAGNOSIS — Z9689 Presence of other specified functional implants: Secondary | ICD-10-CM

## 2017-06-23 DIAGNOSIS — Z01812 Encounter for preprocedural laboratory examination: Secondary | ICD-10-CM

## 2017-06-23 DIAGNOSIS — D62 Acute posthemorrhagic anemia: Secondary | ICD-10-CM | POA: Diagnosis not present

## 2017-06-23 DIAGNOSIS — M1990 Unspecified osteoarthritis, unspecified site: Secondary | ICD-10-CM | POA: Diagnosis present

## 2017-06-23 DIAGNOSIS — Z01810 Encounter for preprocedural cardiovascular examination: Secondary | ICD-10-CM | POA: Diagnosis not present

## 2017-06-23 DIAGNOSIS — Z951 Presence of aortocoronary bypass graft: Secondary | ICD-10-CM

## 2017-06-23 DIAGNOSIS — Z8249 Family history of ischemic heart disease and other diseases of the circulatory system: Secondary | ICD-10-CM

## 2017-06-23 DIAGNOSIS — I2510 Atherosclerotic heart disease of native coronary artery without angina pectoris: Secondary | ICD-10-CM

## 2017-06-23 DIAGNOSIS — J9811 Atelectasis: Secondary | ICD-10-CM | POA: Diagnosis present

## 2017-06-23 DIAGNOSIS — R079 Chest pain, unspecified: Secondary | ICD-10-CM | POA: Diagnosis present

## 2017-06-23 DIAGNOSIS — G4733 Obstructive sleep apnea (adult) (pediatric): Secondary | ICD-10-CM | POA: Diagnosis present

## 2017-06-23 DIAGNOSIS — I10 Essential (primary) hypertension: Secondary | ICD-10-CM | POA: Diagnosis present

## 2017-06-23 DIAGNOSIS — D6959 Other secondary thrombocytopenia: Secondary | ICD-10-CM | POA: Diagnosis not present

## 2017-06-23 DIAGNOSIS — Z87891 Personal history of nicotine dependence: Secondary | ICD-10-CM

## 2017-06-23 DIAGNOSIS — Z7984 Long term (current) use of oral hypoglycemic drugs: Secondary | ICD-10-CM | POA: Diagnosis not present

## 2017-06-23 DIAGNOSIS — I493 Ventricular premature depolarization: Secondary | ICD-10-CM | POA: Diagnosis not present

## 2017-06-23 DIAGNOSIS — Z4682 Encounter for fitting and adjustment of non-vascular catheter: Secondary | ICD-10-CM

## 2017-06-23 DIAGNOSIS — I25110 Atherosclerotic heart disease of native coronary artery with unstable angina pectoris: Secondary | ICD-10-CM | POA: Diagnosis present

## 2017-06-23 HISTORY — PX: CORONARY ARTERY BYPASS GRAFT: SHX141

## 2017-06-23 HISTORY — PX: TEE WITHOUT CARDIOVERSION: SHX5443

## 2017-06-23 LAB — POCT I-STAT, CHEM 8
BUN: 20 mg/dL (ref 6–20)
BUN: 19 mg/dL (ref 6–20)
BUN: 17 mg/dL (ref 6–20)
BUN: 17 mg/dL (ref 6–20)
BUN: 16 mg/dL (ref 6–20)
BUN: 13 mg/dL (ref 6–20)
Calcium, Ion: 1.25 mmol/L (ref 1.15–1.40)
Calcium, Ion: 1.23 mmol/L (ref 1.15–1.40)
Calcium, Ion: 1.06 mmol/L — ABNORMAL LOW (ref 1.15–1.40)
Calcium, Ion: 1.1 mmol/L — ABNORMAL LOW (ref 1.15–1.40)
Calcium, Ion: 1.1 mmol/L — ABNORMAL LOW (ref 1.15–1.40)
Calcium, Ion: 1.15 mmol/L (ref 1.15–1.40)
Chloride: 102 mmol/L (ref 101–111)
Chloride: 103 mmol/L (ref 101–111)
Chloride: 100 mmol/L — ABNORMAL LOW (ref 101–111)
Chloride: 101 mmol/L (ref 101–111)
Chloride: 101 mmol/L (ref 101–111)
Chloride: 101 mmol/L (ref 101–111)
Creatinine, Ser: 0.4 mg/dL — ABNORMAL LOW (ref 0.61–1.24)
Creatinine, Ser: 0.4 mg/dL — ABNORMAL LOW (ref 0.61–1.24)
Creatinine, Ser: 0.4 mg/dL — ABNORMAL LOW (ref 0.61–1.24)
Creatinine, Ser: 0.4 mg/dL — ABNORMAL LOW (ref 0.61–1.24)
Creatinine, Ser: 0.5 mg/dL — ABNORMAL LOW (ref 0.61–1.24)
Creatinine, Ser: 0.5 mg/dL — ABNORMAL LOW (ref 0.61–1.24)
Glucose, Bld: 152 mg/dL — ABNORMAL HIGH (ref 65–99)
Glucose, Bld: 140 mg/dL — ABNORMAL HIGH (ref 65–99)
Glucose, Bld: 123 mg/dL — ABNORMAL HIGH (ref 65–99)
Glucose, Bld: 141 mg/dL — ABNORMAL HIGH (ref 65–99)
Glucose, Bld: 156 mg/dL — ABNORMAL HIGH (ref 65–99)
Glucose, Bld: 198 mg/dL — ABNORMAL HIGH (ref 65–99)
HCT: 42 % (ref 39.0–52.0)
HCT: 39 % (ref 39.0–52.0)
HCT: 33 % — ABNORMAL LOW (ref 39.0–52.0)
HCT: 34 % — ABNORMAL LOW (ref 39.0–52.0)
HCT: 35 % — ABNORMAL LOW (ref 39.0–52.0)
HCT: 40 % (ref 39.0–52.0)
Hemoglobin: 14.3 g/dL (ref 13.0–17.0)
Hemoglobin: 13.3 g/dL (ref 13.0–17.0)
Hemoglobin: 11.2 g/dL — ABNORMAL LOW (ref 13.0–17.0)
Hemoglobin: 11.6 g/dL — ABNORMAL LOW (ref 13.0–17.0)
Hemoglobin: 11.9 g/dL — ABNORMAL LOW (ref 13.0–17.0)
Hemoglobin: 13.6 g/dL (ref 13.0–17.0)
Potassium: 4 mmol/L (ref 3.5–5.1)
Potassium: 4.3 mmol/L (ref 3.5–5.1)
Potassium: 4.1 mmol/L (ref 3.5–5.1)
Potassium: 4.2 mmol/L (ref 3.5–5.1)
Potassium: 3.7 mmol/L (ref 3.5–5.1)
Potassium: 4.1 mmol/L (ref 3.5–5.1)
Sodium: 140 mmol/L (ref 135–145)
Sodium: 139 mmol/L (ref 135–145)
Sodium: 139 mmol/L (ref 135–145)
Sodium: 138 mmol/L (ref 135–145)
Sodium: 140 mmol/L (ref 135–145)
Sodium: 139 mmol/L (ref 135–145)
TCO2: 27 mmol/L (ref 22–32)
TCO2: 28 mmol/L (ref 22–32)
TCO2: 27 mmol/L (ref 22–32)
TCO2: 26 mmol/L (ref 22–32)
TCO2: 26 mmol/L (ref 22–32)
TCO2: 24 mmol/L (ref 22–32)

## 2017-06-23 LAB — POCT I-STAT 3, ART BLOOD GAS (G3+)
Acid-base deficit: 1 mmol/L (ref 0.0–2.0)
Acid-base deficit: 2 mmol/L (ref 0.0–2.0)
Acid-base deficit: 2 mmol/L (ref 0.0–2.0)
Acid-base deficit: 2 mmol/L (ref 0.0–2.0)
Bicarbonate: 25.1 mmol/L (ref 20.0–28.0)
Bicarbonate: 24.7 mmol/L (ref 20.0–28.0)
Bicarbonate: 23.6 mmol/L (ref 20.0–28.0)
Bicarbonate: 23.5 mmol/L (ref 20.0–28.0)
O2 Saturation: 100 %
O2 Saturation: 97 %
O2 Saturation: 97 %
O2 Saturation: 97 %
Patient temperature: 35.9
Patient temperature: 37.3
Patient temperature: 38
TCO2: 26 mmol/L (ref 22–32)
TCO2: 26 mmol/L (ref 22–32)
TCO2: 25 mmol/L (ref 22–32)
TCO2: 25 mmol/L (ref 22–32)
pCO2 arterial: 45 mmHg (ref 32.0–48.0)
pCO2 arterial: 45.3 mmHg (ref 32.0–48.0)
pCO2 arterial: 44.2 mmHg (ref 32.0–48.0)
pCO2 arterial: 46.1 mmHg (ref 32.0–48.0)
pH, Arterial: 7.353 (ref 7.350–7.450)
pH, Arterial: 7.339 — ABNORMAL LOW (ref 7.350–7.450)
pH, Arterial: 7.336 — ABNORMAL LOW (ref 7.350–7.450)
pH, Arterial: 7.321 — ABNORMAL LOW (ref 7.350–7.450)
pO2, Arterial: 337 mmHg — ABNORMAL HIGH (ref 83.0–108.0)
pO2, Arterial: 93 mmHg (ref 83.0–108.0)
pO2, Arterial: 98 mmHg (ref 83.0–108.0)
pO2, Arterial: 98 mmHg (ref 83.0–108.0)

## 2017-06-23 LAB — CREATININE, SERUM
Creatinine, Ser: 0.62 mg/dL (ref 0.61–1.24)
GFR calc Af Amer: >60 mL/min (ref >60)
GFR calc non Af Amer: >60 mL/min (ref >60)

## 2017-06-23 LAB — GLUCOSE, CAPILLARY
Glucose-Capillary: 152 mg/dL — ABNORMAL HIGH (ref 65–99)
Glucose-Capillary: 128 mg/dL — ABNORMAL HIGH (ref 65–99)
Glucose-Capillary: 138 mg/dL — ABNORMAL HIGH (ref 65–99)
Glucose-Capillary: 134 mg/dL — ABNORMAL HIGH (ref 65–99)
Glucose-Capillary: 144 mg/dL — ABNORMAL HIGH (ref 65–99)
Glucose-Capillary: 158 mg/dL — ABNORMAL HIGH (ref 65–99)
Glucose-Capillary: 188 mg/dL — ABNORMAL HIGH (ref 65–99)
Glucose-Capillary: 153 mg/dL — ABNORMAL HIGH (ref 65–99)
Glucose-Capillary: 150 mg/dL — ABNORMAL HIGH (ref 65–99)

## 2017-06-23 LAB — POCT I-STAT 4, (NA,K, GLUC, HGB,HCT)
Glucose, Bld: 121 mg/dL — ABNORMAL HIGH (ref 65–99)
HCT: 37 % — ABNORMAL LOW (ref 39.0–52.0)
Hemoglobin: 12.6 g/dL — ABNORMAL LOW (ref 13.0–17.0)
Potassium: 3.7 mmol/L (ref 3.5–5.1)
Sodium: 144 mmol/L (ref 135–145)

## 2017-06-23 LAB — APTT: aPTT: 34 seconds (ref 24–36)

## 2017-06-23 LAB — CBC
HCT: 37.6 % — ABNORMAL LOW (ref 39.0–52.0)
HCT: 39.7 % (ref 39.0–52.0)
Hemoglobin: 13.2 g/dL (ref 13.0–17.0)
Hemoglobin: 14.2 g/dL (ref 13.0–17.0)
MCH: 31.2 pg (ref 26.0–34.0)
MCH: 31.8 pg (ref 26.0–34.0)
MCHC: 35.1 g/dL (ref 30.0–36.0)
MCHC: 35.8 g/dL (ref 30.0–36.0)
MCV: 88.9 fL (ref 78.0–100.0)
MCV: 89 fL (ref 78.0–100.0)
PLATELETS: 71 10*3/uL — AB (ref 150–400)
Platelets: 103 10*3/uL — ABNORMAL LOW (ref 150–400)
RBC: 4.23 MIL/uL (ref 4.22–5.81)
RBC: 4.46 MIL/uL (ref 4.22–5.81)
RDW: 12.9 % (ref 11.5–15.5)
RDW: 12.8 % (ref 11.5–15.5)
WBC: 8.7 10*3/uL (ref 4.0–10.5)
WBC: 13.6 10*3/uL — ABNORMAL HIGH (ref 4.0–10.5)

## 2017-06-23 LAB — HEMOGLOBIN AND HEMATOCRIT, BLOOD
HCT: 36.8 % — ABNORMAL LOW (ref 39.0–52.0)
Hemoglobin: 12.7 g/dL — ABNORMAL LOW (ref 13.0–17.0)

## 2017-06-23 LAB — PROTIME-INR
INR: 1.48
PROTHROMBIN TIME: 17.8 s — AB (ref 11.4–15.2)

## 2017-06-23 LAB — PLATELET COUNT: Platelets: 109 10*3/uL — ABNORMAL LOW (ref 150–400)

## 2017-06-23 LAB — MAGNESIUM: Magnesium: 2.4 mg/dL (ref 1.7–2.4)

## 2017-06-23 SURGERY — CORONARY ARTERY BYPASS GRAFTING (CABG)
Anesthesia: General | Site: Chest

## 2017-06-23 MED ORDER — THROMBIN 5000 UNITS EX SOLR
CUTANEOUS | Status: DC | PRN
  Administered 2017-06-23: 5000 [IU] via TOPICAL

## 2017-06-23 MED ORDER — LACTATED RINGERS IV SOLN
INTRAVENOUS | Status: DC | PRN
  Administered 2017-06-23: 08:00:00 via INTRAVENOUS

## 2017-06-23 MED ORDER — ALBUMIN HUMAN 5 % IV SOLN
INTRAVENOUS | Status: DC | PRN
  Administered 2017-06-23 (×2): via INTRAVENOUS

## 2017-06-23 MED ORDER — CHLORHEXIDINE GLUCONATE 4 % EX LIQD: 30.0000 mL | CUTANEOUS | Status: DC

## 2017-06-23 MED ORDER — ACETAMINOPHEN 650 MG RE SUPP
650.0000 mg | Freq: Once | RECTAL | Status: AC
  Administered 2017-06-23: 650 mg via RECTAL

## 2017-06-23 MED ORDER — PROTAMINE SULFATE 10 MG/ML IV SOLN
INTRAVENOUS | Status: AC
  Filled 2017-06-23: qty 25

## 2017-06-23 MED ORDER — METOCLOPRAMIDE HCL 5 MG/ML IJ SOLN
10.0000 mg | Freq: Four times a day (QID) | INTRAMUSCULAR | Status: AC
  Administered 2017-06-23 – 2017-06-24 (×4): 10 mg via INTRAVENOUS
  Filled 2017-06-23 (×4): qty 2

## 2017-06-23 MED ORDER — SODIUM CHLORIDE 0.9 % IJ SOLN
INTRAMUSCULAR | Status: AC
  Filled 2017-06-23: qty 20

## 2017-06-23 MED ORDER — NITROGLYCERIN IN D5W 200-5 MCG/ML-% IV SOLN: 0.0000 ug/min | INTRAVENOUS | Status: DC

## 2017-06-23 MED ORDER — METOPROLOL TARTRATE 12.5 MG HALF TABLET
12.5000 mg | ORAL_TABLET | Freq: Two times a day (BID) | ORAL | Status: DC
  Administered 2017-06-24 (×2): 12.5 mg via ORAL
  Filled 2017-06-23 (×2): qty 1

## 2017-06-23 MED ORDER — ONDANSETRON HCL 4 MG/2ML IJ SOLN
4.0000 mg | Freq: Four times a day (QID) | INTRAMUSCULAR | Status: DC | PRN
  Administered 2017-06-23 – 2017-06-25 (×3): 4 mg via INTRAVENOUS
  Filled 2017-06-23 (×3): qty 2

## 2017-06-23 MED ORDER — LACTATED RINGERS IV SOLN: 500.0000 mL | Freq: Once | INTRAVENOUS | Status: DC | PRN

## 2017-06-23 MED ORDER — LACTATED RINGERS IV SOLN
INTRAVENOUS | Status: DC | PRN
  Administered 2017-06-23 (×2): via INTRAVENOUS

## 2017-06-23 MED ORDER — MAGNESIUM SULFATE 4 GM/100ML IV SOLN
4.0000 g | Freq: Once | INTRAVENOUS | Status: AC
  Administered 2017-06-23 (×2): 4 g via INTRAVENOUS

## 2017-06-23 MED ORDER — SODIUM CHLORIDE 0.9 % IV SOLN: INTRAVENOUS | Status: DC

## 2017-06-23 MED ORDER — MAGNESIUM SULFATE 4 GM/100ML IV SOLN
INTRAVENOUS | Status: AC
  Administered 2017-06-23: 4 g via INTRAVENOUS
  Filled 2017-06-23: qty 100

## 2017-06-23 MED ORDER — CHLORHEXIDINE GLUCONATE 0.12% ORAL RINSE (MEDLINE KIT): 15.0000 mL | Freq: Two times a day (BID) | OROMUCOSAL | Status: DC

## 2017-06-23 MED ORDER — METOPROLOL TARTRATE 25 MG/10 ML ORAL SUSPENSION: 12.5000 mg | Freq: Two times a day (BID) | ORAL | Status: DC

## 2017-06-23 MED ORDER — METOPROLOL TARTRATE 12.5 MG HALF TABLET: 12.5000 mg | ORAL_TABLET | Freq: Once | ORAL | Status: DC

## 2017-06-23 MED ORDER — SODIUM CHLORIDE 0.9 % IV SOLN
0.0000 ug/min | INTRAVENOUS | Status: DC
  Filled 2017-06-23: qty 2

## 2017-06-23 MED ORDER — CHLORHEXIDINE GLUCONATE 0.12 % MT SOLN
15.0000 mL | OROMUCOSAL | Status: AC
  Administered 2017-06-23: 15 mL via OROMUCOSAL

## 2017-06-23 MED ORDER — PROPOFOL 10 MG/ML IV BOLUS
INTRAVENOUS | Status: AC
  Filled 2017-06-23: qty 20

## 2017-06-23 MED ORDER — MIDAZOLAM HCL 5 MG/5ML IJ SOLN
INTRAMUSCULAR | Status: DC | PRN
  Administered 2017-06-23: 2 mg via INTRAVENOUS
  Administered 2017-06-23: 1 mg via INTRAVENOUS
  Administered 2017-06-23: 4 mg via INTRAVENOUS
  Administered 2017-06-23: 1 mg via INTRAVENOUS
  Administered 2017-06-23 (×2): 2 mg via INTRAVENOUS

## 2017-06-23 MED ORDER — FENTANYL CITRATE (PF) 250 MCG/5ML IJ SOLN
INTRAMUSCULAR | Status: AC
  Filled 2017-06-23: qty 5

## 2017-06-23 MED ORDER — DEXMEDETOMIDINE HCL IN NACL 200 MCG/50ML IV SOLN
INTRAVENOUS | Status: AC
  Filled 2017-06-23: qty 50

## 2017-06-23 MED ORDER — SODIUM CHLORIDE 0.9 % IV SOLN
INTRAVENOUS | Status: DC
  Filled 2017-06-23: qty 1

## 2017-06-23 MED ORDER — PRAVASTATIN SODIUM 20 MG PO TABS
10.0000 mg | ORAL_TABLET | Freq: Every day | ORAL | Status: DC
  Administered 2017-06-24 – 2017-06-26 (×3): 10 mg via ORAL
  Filled 2017-06-23 (×4): qty 1

## 2017-06-23 MED ORDER — PROPOFOL 10 MG/ML IV BOLUS
INTRAVENOUS | Status: DC | PRN
  Administered 2017-06-23: 80 mg via INTRAVENOUS

## 2017-06-23 MED ORDER — INSULIN REGULAR BOLUS VIA INFUSION
0.0000 [IU] | Freq: Three times a day (TID) | INTRAVENOUS | Status: DC
  Filled 2017-06-23: qty 10

## 2017-06-23 MED ORDER — MIDAZOLAM HCL 10 MG/2ML IJ SOLN
INTRAMUSCULAR | Status: AC
  Filled 2017-06-23: qty 2

## 2017-06-23 MED ORDER — SODIUM CHLORIDE 0.9% FLUSH
3.0000 mL | Freq: Two times a day (BID) | INTRAVENOUS | Status: DC
  Administered 2017-06-24 – 2017-06-26 (×5): 3 mL via INTRAVENOUS

## 2017-06-23 MED ORDER — ARTIFICIAL TEARS OPHTHALMIC OINT
TOPICAL_OINTMENT | OPHTHALMIC | Status: DC | PRN
  Administered 2017-06-23: 1 via OPHTHALMIC

## 2017-06-23 MED ORDER — CEFAZOLIN SODIUM-DEXTROSE 2-4 GM/100ML-% IV SOLN
2.0000 g | Freq: Three times a day (TID) | INTRAVENOUS | Status: AC
  Administered 2017-06-23 – 2017-06-25 (×6): 2 g via INTRAVENOUS
  Filled 2017-06-23 (×6): qty 100

## 2017-06-23 MED ORDER — SODIUM CHLORIDE 0.9% FLUSH: 3.0000 mL | INTRAVENOUS | Status: DC | PRN

## 2017-06-23 MED ORDER — ORAL CARE MOUTH RINSE: 15.0000 mL | Freq: Four times a day (QID) | OROMUCOSAL | Status: DC

## 2017-06-23 MED ORDER — MIDAZOLAM HCL 2 MG/2ML IJ SOLN
2.0000 mg | INTRAMUSCULAR | Status: DC | PRN
  Administered 2017-06-23: 2 mg via INTRAVENOUS
  Filled 2017-06-23: qty 2

## 2017-06-23 MED ORDER — HEMOSTATIC AGENTS (NO CHARGE) OPTIME
TOPICAL | Status: DC | PRN
  Administered 2017-06-23 (×2): 1 via TOPICAL

## 2017-06-23 MED ORDER — VANCOMYCIN HCL IN DEXTROSE 1-5 GM/200ML-% IV SOLN
1000.0000 mg | Freq: Once | INTRAVENOUS | Status: AC
  Administered 2017-06-23: 1000 mg via INTRAVENOUS
  Filled 2017-06-23: qty 200

## 2017-06-23 MED ORDER — FENTANYL CITRATE (PF) 250 MCG/5ML IJ SOLN
INTRAMUSCULAR | Status: AC
  Filled 2017-06-23: qty 25

## 2017-06-23 MED ORDER — ALBUTEROL SULFATE (2.5 MG/3ML) 0.083% IN NEBU: 2.5000 mg | INHALATION_SOLUTION | Freq: Four times a day (QID) | RESPIRATORY_TRACT | Status: DC | PRN

## 2017-06-23 MED ORDER — CHLORHEXIDINE GLUCONATE 0.12 % MT SOLN: 15.0000 mL | Freq: Once | OROMUCOSAL | Status: DC

## 2017-06-23 MED ORDER — ASPIRIN EC 325 MG PO TBEC
325.0000 mg | DELAYED_RELEASE_TABLET | Freq: Every day | ORAL | Status: DC
  Administered 2017-06-24 – 2017-06-27 (×4): 325 mg via ORAL
  Filled 2017-06-23 (×4): qty 1

## 2017-06-23 MED ORDER — ACETAMINOPHEN 160 MG/5ML PO SOLN: 650.0000 mg | Freq: Once | ORAL | Status: AC

## 2017-06-23 MED ORDER — LACTATED RINGERS IV SOLN
INTRAVENOUS | Status: DC | PRN
Start: 2017-06-23 — End: 2017-06-23
  Administered 2017-06-23: 08:00:00 via INTRAVENOUS

## 2017-06-23 MED ORDER — BISACODYL 5 MG PO TBEC
10.0000 mg | DELAYED_RELEASE_TABLET | Freq: Every day | ORAL | Status: DC
  Administered 2017-06-24 – 2017-06-27 (×4): 10 mg via ORAL
  Filled 2017-06-23 (×3): qty 2

## 2017-06-23 MED ORDER — SODIUM CHLORIDE 0.9 % IJ SOLN
OROMUCOSAL | Status: DC | PRN
  Administered 2017-06-23 (×3): via TOPICAL

## 2017-06-23 MED ORDER — HEPARIN SODIUM (PORCINE) 1000 UNIT/ML IJ SOLN
INTRAMUSCULAR | Status: AC
  Filled 2017-06-23: qty 1

## 2017-06-23 MED ORDER — LACTATED RINGERS IV SOLN
INTRAVENOUS | Status: DC
  Administered 2017-06-23: 22:00:00 via INTRAVENOUS

## 2017-06-23 MED ORDER — ALBUMIN HUMAN 5 % IV SOLN
250.0000 mL | INTRAVENOUS | Status: AC | PRN
  Administered 2017-06-23: 250 mL via INTRAVENOUS

## 2017-06-23 MED ORDER — ONDANSETRON HCL 4 MG/2ML IJ SOLN
INTRAMUSCULAR | Status: DC | PRN
  Administered 2017-06-23: 4 mg via INTRAVENOUS

## 2017-06-23 MED ORDER — ONDANSETRON HCL 4 MG/2ML IJ SOLN
INTRAMUSCULAR | Status: AC
  Filled 2017-06-23: qty 2

## 2017-06-23 MED ORDER — HEPARIN SODIUM (PORCINE) 1000 UNIT/ML IJ SOLN
INTRAMUSCULAR | Status: DC | PRN
  Administered 2017-06-23: 30000 [IU] via INTRAVENOUS

## 2017-06-23 MED ORDER — ACETAMINOPHEN 160 MG/5ML PO SOLN: 1000.0000 mg | Freq: Four times a day (QID) | ORAL | Status: DC

## 2017-06-23 MED ORDER — OXYCODONE HCL 5 MG PO TABS
5.0000 mg | ORAL_TABLET | ORAL | Status: DC | PRN
  Administered 2017-06-23 – 2017-06-25 (×6): 10 mg via ORAL
  Filled 2017-06-23 (×7): qty 2

## 2017-06-23 MED ORDER — PHENYLEPHRINE HCL 10 MG/ML IJ SOLN
INTRAMUSCULAR | Status: DC | PRN
  Administered 2017-06-23: 20 ug via INTRAVENOUS

## 2017-06-23 MED ORDER — GLYCOPYRROLATE 0.2 MG/ML IJ SOLN
INTRAMUSCULAR | Status: DC | PRN
  Administered 2017-06-23: 0.2 mg via INTRAVENOUS

## 2017-06-23 MED ORDER — POTASSIUM CHLORIDE 10 MEQ/50ML IV SOLN
10.0000 meq | INTRAVENOUS | Status: AC
  Administered 2017-06-23 (×3): 10 meq via INTRAVENOUS

## 2017-06-23 MED ORDER — LACTATED RINGERS IV SOLN: INTRAVENOUS | Status: DC

## 2017-06-23 MED ORDER — DOCUSATE SODIUM 100 MG PO CAPS
200.0000 mg | ORAL_CAPSULE | Freq: Every day | ORAL | Status: DC
  Administered 2017-06-24 – 2017-06-26 (×3): 200 mg via ORAL
  Filled 2017-06-23 (×4): qty 2

## 2017-06-23 MED ORDER — MORPHINE SULFATE (PF) 2 MG/ML IV SOLN
2.0000 mg | INTRAVENOUS | Status: DC | PRN
  Administered 2017-06-24: 4 mg via INTRAVENOUS
  Filled 2017-06-23 (×2): qty 2

## 2017-06-23 MED ORDER — THROMBIN 5000 UNITS EX SOLR
CUTANEOUS | Status: AC
  Filled 2017-06-23: qty 5000

## 2017-06-23 MED ORDER — PHENYLEPHRINE HCL 10 MG/ML IJ SOLN
INTRAVENOUS | Status: DC | PRN
  Administered 2017-06-23: 25 ug/min via INTRAVENOUS

## 2017-06-23 MED ORDER — ASPIRIN 81 MG PO CHEW: 324.0000 mg | CHEWABLE_TABLET | Freq: Every day | ORAL | Status: DC

## 2017-06-23 MED ORDER — PANTOPRAZOLE SODIUM 40 MG PO TBEC
40.0000 mg | DELAYED_RELEASE_TABLET | Freq: Every day | ORAL | Status: DC
  Administered 2017-06-25 – 2017-06-27 (×3): 40 mg via ORAL
  Filled 2017-06-23 (×3): qty 1

## 2017-06-23 MED ORDER — ORAL CARE MOUTH RINSE
15.0000 mL | Freq: Two times a day (BID) | OROMUCOSAL | Status: DC
  Administered 2017-06-23 – 2017-06-26 (×4): 15 mL via OROMUCOSAL

## 2017-06-23 MED ORDER — SODIUM CHLORIDE 0.45 % IV SOLN
INTRAVENOUS | Status: DC | PRN
  Administered 2017-06-23: 15:00:00 via INTRAVENOUS

## 2017-06-23 MED ORDER — BISACODYL 10 MG RE SUPP: 10.0000 mg | Freq: Every day | RECTAL | Status: DC

## 2017-06-23 MED ORDER — METOPROLOL TARTRATE 5 MG/5ML IV SOLN: 2.5000 mg | INTRAVENOUS | Status: DC | PRN

## 2017-06-23 MED ORDER — MORPHINE SULFATE (PF) 2 MG/ML IV SOLN
1.0000 mg | INTRAVENOUS | Status: AC | PRN
  Administered 2017-06-23 (×2): 4 mg via INTRAVENOUS
  Administered 2017-06-23: 1 mg via INTRAVENOUS
  Administered 2017-06-23 – 2017-06-24 (×4): 4 mg via INTRAVENOUS
  Filled 2017-06-23 (×6): qty 2
  Filled 2017-06-23: qty 1

## 2017-06-23 MED ORDER — MIDAZOLAM HCL 2 MG/2ML IJ SOLN
INTRAMUSCULAR | Status: AC
  Filled 2017-06-23: qty 2

## 2017-06-23 MED ORDER — SODIUM CHLORIDE 0.9 % IV SOLN: 250.0000 mL | INTRAVENOUS | Status: DC

## 2017-06-23 MED ORDER — ACETAMINOPHEN 500 MG PO TABS
1000.0000 mg | ORAL_TABLET | Freq: Four times a day (QID) | ORAL | Status: DC
  Administered 2017-06-23 – 2017-06-27 (×14): 1000 mg via ORAL
  Filled 2017-06-23 (×14): qty 2

## 2017-06-23 MED ORDER — PROTAMINE SULFATE 10 MG/ML IV SOLN
INTRAVENOUS | Status: DC | PRN
  Administered 2017-06-23: 50 mg via INTRAVENOUS
  Administered 2017-06-23: 100 mg via INTRAVENOUS
  Administered 2017-06-23 (×3): 50 mg via INTRAVENOUS

## 2017-06-23 MED ORDER — PROTAMINE SULFATE 10 MG/ML IV SOLN
INTRAVENOUS | Status: AC
  Filled 2017-06-23: qty 5

## 2017-06-23 MED ORDER — ROCURONIUM BROMIDE 100 MG/10ML IV SOLN
INTRAVENOUS | Status: DC | PRN
  Administered 2017-06-23 (×6): 50 mg via INTRAVENOUS

## 2017-06-23 MED ORDER — FENTANYL CITRATE (PF) 250 MCG/5ML IJ SOLN
INTRAMUSCULAR | Status: DC | PRN
  Administered 2017-06-23: 150 ug via INTRAVENOUS
  Administered 2017-06-23 (×2): 25 ug via INTRAVENOUS
  Administered 2017-06-23: 250 ug via INTRAVENOUS
  Administered 2017-06-23: 200 ug via INTRAVENOUS
  Administered 2017-06-23: 100 ug via INTRAVENOUS
  Administered 2017-06-23: 250 ug via INTRAVENOUS
  Administered 2017-06-23: 150 ug via INTRAVENOUS
  Administered 2017-06-23: 100 ug via INTRAVENOUS
  Administered 2017-06-23: 25 ug via INTRAVENOUS
  Administered 2017-06-23: 50 ug via INTRAVENOUS

## 2017-06-23 MED ORDER — SODIUM CHLORIDE 0.9 % IV SOLN
0.0000 ug/kg/h | INTRAVENOUS | Status: DC
  Administered 2017-06-23: 0.2 ug/kg/h via INTRAVENOUS
  Filled 2017-06-23: qty 2

## 2017-06-23 MED ORDER — FAMOTIDINE IN NACL 20-0.9 MG/50ML-% IV SOLN: 20.0000 mg | Freq: Two times a day (BID) | INTRAVENOUS | Status: AC

## 2017-06-23 MED ORDER — TRAMADOL HCL 50 MG PO TABS
50.0000 mg | ORAL_TABLET | ORAL | Status: DC | PRN
  Administered 2017-06-23: 100 mg via ORAL
  Filled 2017-06-23: qty 2

## 2017-06-23 SURGICAL SUPPLY — 77 items
BAG DECANTER FOR FLEXI CONT (MISCELLANEOUS) ×3 IMPLANT
BANDAGE ACE 4X5 VEL STRL LF (GAUZE/BANDAGES/DRESSINGS) ×3 IMPLANT
BANDAGE ACE 6X5 VEL STRL LF (GAUZE/BANDAGES/DRESSINGS) ×3 IMPLANT
BANDAGE ELASTIC 4 VELCRO ST LF (GAUZE/BANDAGES/DRESSINGS) ×3 IMPLANT
BANDAGE ELASTIC 6 VELCRO ST LF (GAUZE/BANDAGES/DRESSINGS) ×3 IMPLANT
BLADE STERNUM SYSTEM 6 (BLADE) ×3 IMPLANT
BLADE SURG 11 STRL SS (BLADE) ×3 IMPLANT
BNDG GAUZE ELAST 4 BULKY (GAUZE/BANDAGES/DRESSINGS) ×3 IMPLANT
CANISTER SUCT 3000ML PPV (MISCELLANEOUS) ×3 IMPLANT
CATH CPB KIT GERHARDT (MISCELLANEOUS) ×3 IMPLANT
CATH THORACIC 28FR (CATHETERS) ×3 IMPLANT
CLIP RETRACTION 3.0MM CORONARY (MISCELLANEOUS) ×3 IMPLANT
CLIP VESOCCLUDE SM WIDE 24/CT (CLIP) ×3 IMPLANT
CRADLE DONUT ADULT HEAD (MISCELLANEOUS) ×3 IMPLANT
DERMABOND ADVANCED (GAUZE/BANDAGES/DRESSINGS) ×1
DERMABOND ADVANCED .7 DNX12 (GAUZE/BANDAGES/DRESSINGS) ×2 IMPLANT
DRAIN CHANNEL 28F RND 3/8 FF (WOUND CARE) ×3 IMPLANT
DRAPE CARDIOVASCULAR INCISE (DRAPES) ×1
DRAPE SLUSH/WARMER DISC (DRAPES) ×3 IMPLANT
DRAPE SRG 135X102X78XABS (DRAPES) ×2 IMPLANT
DRESSING PREVENA PLUS CUSTOM (GAUZE/BANDAGES/DRESSINGS) ×2 IMPLANT
DRSG AQUACEL AG ADV 3.5X14 (GAUZE/BANDAGES/DRESSINGS) ×3 IMPLANT
DRSG PREVENA PLUS CUSTOM (GAUZE/BANDAGES/DRESSINGS) ×3
ELECT BLADE 4.0 EZ CLEAN MEGAD (MISCELLANEOUS) ×3
ELECT REM PT RETURN 9FT ADLT (ELECTROSURGICAL) ×6
ELECTRODE BLDE 4.0 EZ CLN MEGD (MISCELLANEOUS) ×2 IMPLANT
ELECTRODE REM PT RTRN 9FT ADLT (ELECTROSURGICAL) ×4 IMPLANT
FELT TEFLON 1X6 (MISCELLANEOUS) ×6 IMPLANT
GAUZE SPONGE 4X4 12PLY STRL (GAUZE/BANDAGES/DRESSINGS) ×6 IMPLANT
GLOVE BIO SURGEON STRL SZ 6.5 (GLOVE) ×9 IMPLANT
GLOVE BIOGEL PI IND STRL 6 (GLOVE) ×2 IMPLANT
GLOVE BIOGEL PI INDICATOR 6 (GLOVE) ×1
GOWN STRL REUS W/ TWL LRG LVL3 (GOWN DISPOSABLE) ×8 IMPLANT
GOWN STRL REUS W/TWL LRG LVL3 (GOWN DISPOSABLE) ×4
HEMOSTAT POWDER SURGIFOAM 1G (HEMOSTASIS) ×9 IMPLANT
HEMOSTAT SURGICEL 2X14 (HEMOSTASIS) ×3 IMPLANT
KIT BASIN OR (CUSTOM PROCEDURE TRAY) ×3 IMPLANT
KIT CATH SUCT 8FR (CATHETERS) ×3 IMPLANT
KIT PREVENA INCISION MGT20CM45 (CANNISTER) ×3 IMPLANT
KIT ROOM TURNOVER OR (KITS) ×3 IMPLANT
KIT SUCTION CATH 14FR (SUCTIONS) ×6 IMPLANT
KIT VASOVIEW HEMOPRO VH 3000 (KITS) ×6 IMPLANT
LEAD PACING MYOCARDI (MISCELLANEOUS) ×3 IMPLANT
MARKER GRAFT CORONARY BYPASS (MISCELLANEOUS) ×9 IMPLANT
NS IRRIG 1000ML POUR BTL (IV SOLUTION) ×15 IMPLANT
PACK E OPEN HEART (SUTURE) ×3 IMPLANT
PACK OPEN HEART (CUSTOM PROCEDURE TRAY) ×3 IMPLANT
PAD ARMBOARD 7.5X6 YLW CONV (MISCELLANEOUS) ×6 IMPLANT
PAD ELECT DEFIB RADIOL ZOLL (MISCELLANEOUS) ×3 IMPLANT
PENCIL BUTTON HOLSTER BLD 10FT (ELECTRODE) ×3 IMPLANT
PUNCH AORTIC ROT 4.0MM RCL 40 (MISCELLANEOUS) ×3 IMPLANT
SET CARDIOPLEGIA MPS 5001102 (MISCELLANEOUS) ×3 IMPLANT
SOLUTION ANTI FOG 6CC (MISCELLANEOUS) ×3 IMPLANT
SUT BONE WAX W31G (SUTURE) ×3 IMPLANT
SUT ETHIBOND 2 0 SH (SUTURE) ×1
SUT ETHIBOND 2 0 SH 36X2 (SUTURE) ×2 IMPLANT
SUT MNCRL AB 4-0 PS2 18 (SUTURE) ×3 IMPLANT
SUT PROLENE 3 0 SH1 36 (SUTURE) ×3 IMPLANT
SUT PROLENE 4 0 TF (SUTURE) ×6 IMPLANT
SUT PROLENE 6 0 C 1 30 (SUTURE) ×3 IMPLANT
SUT PROLENE 6 0 CC (SUTURE) ×18 IMPLANT
SUT PROLENE 7 0 BV1 MDA (SUTURE) ×3 IMPLANT
SUT PROLENE 8 0 BV175 6 (SUTURE) ×9 IMPLANT
SUT STEEL 6MS V (SUTURE) ×6 IMPLANT
SUT STEEL SZ 6 DBL 3X14 BALL (SUTURE) ×3 IMPLANT
SUT VIC AB 1 CTX 18 (SUTURE) ×6 IMPLANT
SUT VIC AB 2-0 CT1 27 (SUTURE) ×1
SUT VIC AB 2-0 CT1 TAPERPNT 27 (SUTURE) ×2 IMPLANT
SYSTEM SAHARA CHEST DRAIN ATS (WOUND CARE) ×3 IMPLANT
TAPE CLOTH SURG 4X10 WHT LF (GAUZE/BANDAGES/DRESSINGS) ×6 IMPLANT
TAPE PAPER 2X10 WHT MICROPORE (GAUZE/BANDAGES/DRESSINGS) ×3 IMPLANT
TOWEL GREEN STERILE (TOWEL DISPOSABLE) ×3 IMPLANT
TOWEL GREEN STERILE FF (TOWEL DISPOSABLE) ×3 IMPLANT
TRAY FOLEY SILVER 16FR TEMP (SET/KITS/TRAYS/PACK) ×3 IMPLANT
TUBING INSUFFLATION (TUBING) ×3 IMPLANT
UNDERPAD 30X30 (UNDERPADS AND DIAPERS) ×3 IMPLANT
WATER STERILE IRR 1000ML POUR (IV SOLUTION) ×6 IMPLANT

## 2017-06-23 NOTE — Progress Notes (Signed)
CT surgery p.m. Rounds  Patient stable after multivessel CABG earlier today Extubated with good O2 sats-post extubation blood gas satisfactory 6-hour labs still pending

## 2017-06-23 NOTE — Transfer of Care (Signed)
Immediate Anesthesia Transfer of Care Note  Patient: Phillip Le  Procedure(s) Performed: CORONARY ARTERY BYPASS GRAFTING (CABG) x 4, on pump,LIMA to LAD, SVG to OM, SVG to DIAGONAL, SVG to DISTAL RCA, using left internal mammary artery and right greater saphenous vein harvested endoscopically (N/A Chest) TRANSESOPHAGEAL ECHOCARDIOGRAM (TEE) (N/A )  Patient Location: SICU  Anesthesia Type:General  Level of Consciousness: Patient remains intubated per anesthesia plan  Airway & Oxygen Therapy: Patient remains intubated per anesthesia plan and Patient placed on Ventilator (see vital sign flow sheet for setting)  Post-op Assessment: Report given to RN and Post -op Vital signs reviewed and stable  Post vital signs: Reviewed and stable  Last Vitals:  Vitals:   06/23/17 0632  BP: (!) 145/86  Pulse: (!) 57  Resp: 20  Temp: 36.5 C  SpO2: 97%    Last Pain:  Vitals:   06/23/17 0655  PainSc: 0-No pain      Patients Stated Pain Goal: 3 (29/47/65 [id number])  Complications: No apparent anesthesia complications

## 2017-06-23 NOTE — H&P (Signed)
[address]       [place] [address]             [phone]                    Phillip Le [place] Medical Record #[id number] Date of Birth: 07/16/1950  Referring: Teodoro Spray, MD Primary Care: Sofie Hartigan, MD Primary Cardiologist: Teodoro Spray, MD  Chief Complaint:    Chest Pain    History of Present Illness:    Phillip Le 67 y.o. male  Was  seen in the office  today for evaluation of possible coronary artery bypass grafting Monday.  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   The following day he went to the [facility] emergency room because of recurrence of similar discomfort but ultimately was discharged home.  He not had any previous episodes of similar pain and had not had rest chest pain occur.  He was seen by Dr. Ubaldo Glassing and a nuclear stress test was performed noting ischemia.  Cardiac catheterization was done 3 days ago,  Demonstrated three-vessel coronary artery disease  Patient has a history of diabetes being treated for the past 3-4 years, his hemoglobin A1c has been as high as 9 but most recently 6.3.  He is currently on metformin.   Current Activity/ Functional Status:  Patient is independent with mobility/ambulation, transfers, ADL's, IADL's.   Zubrod Score: At the time of surgery this patient's most appropriate activity status/level should be described as: []     0    Normal activity, no symptoms [x]     1    Restricted in physical strenuous activity but ambulatory, able to do out light work []     2    Ambulatory and capable of self care, unable to do work activities, up and about               >50 % of waking hours                              []     3    Only limited self care, in bed greater than 50% of waking hours []     4    Completely disabled, no self care, confined to bed or chair []     5    Moribund   Past Medical History:    Diagnosis Date  . Allergy   . Arthritis   . Coronary artery disease   . Diabetes mellitus without complication ([place])   . Dysrhythmia   . Elevated lipids   . Gout   . History of kidney stones   . Hypertension   . Sleep apnea   . Wears glasses     Past Surgical History:  Procedure Laterality Date  . ADENOIDECTOMY    . APPENDECTOMY    . COLONOSCOPY WITH PROPOFOL N/A 11/30/2016   Procedure: COLONOSCOPY WITH PROPOFOL;  Surgeon: Manya Silvas, MD;  Location: [facility];  Service: Endoscopy;  Laterality: N/A;  . LEFT HEART CATH AND CORONARY ANGIOGRAPHY Left 06/18/2017   Procedure: LEFT HEART CATH AND CORONARY ANGIOGRAPHY;  Surgeon: Teodoro Spray, MD;  Location: [facility];  Service: Cardiovascular;  Laterality: Left;  . SHOULDER SURGERY Right    approx 8-10 years ago  . TONSILLECTOMY  Family History  Problem Relation Age of Onset  . CAD Mother   . Cancer Father   . Myocardial Infarction (Heart attack) Mother  . Throat cancer Father   Patient's father died at age 30 of malignancy patient notes that he had been an alcoholic for many years Patient's mother died at age 66 of cardiac disease, he has 3 sisters and 2 brothers alive and well   Social History   Socioeconomic History  . Marital status: Married    Spouse name: Not on file  Occupational History  .  Patient is currently retired but remains active caring for horses   Tobacco Use  . Smoking status: Former Smoker    Last attempt to quit: 06/19/1978    Years since quitting: 39.0  . Smokeless tobacco: Never Used  Substance and Sexual Activity  . Alcohol use: No  . Drug use: No       No Known Allergies  Current Facility-Administered Medications  Medication Dose Route Frequency Provider Last Rate Last Dose  . cefUROXime (ZINACEF) 1.5 g in sodium chloride 0.9 % 100 mL IVPB  1.5 g Intravenous To OR Grace Isaac, MD      . cefUROXime (ZINACEF) 750 mg in sodium chloride 0.9 % 100 mL IVPB   750 mg Intravenous To OR Grace Isaac, MD      . chlorhexidine (HIBICLENS) 4 % liquid 2 application  30 mL Topical UD Grace Isaac, MD      . chlorhexidine (PERIDEX) 0.12 % solution 15 mL  15 mL Mouth/Throat Once Grace Isaac, MD      . dexmedetomidine (PRECEDEX) 400 MCG/100ML (4 mcg/mL) infusion  0.1-0.7 mcg/kg/hr Intravenous To OR Grace Isaac, MD      . DOPamine (INTROPIN) 800 mg in dextrose 5 % 250 mL (3.2 mg/mL) infusion  0-10 mcg/kg/min Intravenous To OR Grace Isaac, MD      . EPINEPHrine (ADRENALIN) 4 mg in dextrose 5 % 250 mL (0.016 mg/mL) infusion  0-10 mcg/min Intravenous To OR Grace Isaac, MD      . heparin 2,500 Units, papaverine 30 mg in electrolyte-148 (PLASMALYTE-148) 500 mL irrigation   Irrigation To OR Grace Isaac, MD      . heparin 30,000 units/NS 1000 mL solution for CELLSAVER   Other To OR Grace Isaac, MD      . insulin regular (NOVOLIN R,HUMULIN R) 100 Units in sodium chloride 0.9 % 100 mL (1 Units/mL) infusion   Intravenous To OR Grace Isaac, MD      . magnesium sulfate (IV Push/IM) injection 40 mEq  40 mEq Other To OR Grace Isaac, MD      . metoprolol tartrate (LOPRESSOR) tablet 12.5 mg  12.5 mg Oral Once Grace Isaac, MD      . metoprolol tartrate (LOPRESSOR) tablet 12.5 mg  12.5 mg Oral Once Grace Isaac, MD      . milrinone (PRIMACOR) 20 MG/100 ML (0.2 mg/mL) infusion  0.125 mcg/kg/min Intravenous To OR Grace Isaac, MD      . nitroGLYCERIN 50 mg in dextrose 5 % 250 mL (0.2 mg/mL) infusion  2-200 mcg/min Intravenous To OR Grace Isaac, MD      . phenylephrine (NEO-SYNEPHRINE) 20 mg in sodium chloride 0.9 % 250 mL (0.08 mg/mL) infusion  30-200 mcg/min Intravenous To OR Grace Isaac, MD      . potassium chloride injection 80 mEq  80 mEq Other To OR  Grace Isaac, MD      . tranexamic acid (CYKLOKAPRON) 2,500 mg in sodium chloride 0.9 % 250 mL (10 mg/mL) infusion  1.5 mg/kg/hr  Intravenous To OR Grace Isaac, MD      . tranexamic acid (CYKLOKAPRON) bolus via infusion - over 30 minutes 1,428 mg  15 mg/kg Intravenous To OR Grace Isaac, MD      . tranexamic acid (CYKLOKAPRON) pump prime solution 190 mg  2 mg/kg Intracatheter To OR Grace Isaac, MD        Pertinent items are noted in HPI.   Review of Systems:     Cardiac Review of Systems: [Y] = yes  or   [  ] = no   Chest Pain [  y  ]  Resting SOB [ n  ] Exertional SOB  Blue.Reese  ]  Orthopnea [ n ]   Pedal Edema [n   ]    Palpitations Blue.Reese  ] Syncope  [ n ]   Presyncope [n   ]  General Review of Systems: [Y] = yes [  ]=no Constitional: recent weight change [ n ];  Wt loss over the last 3 months [   ] anorexia [  ]; fatigue [ y ]; nausea [ n ]; night sweats [n  ]; fever [ n ]; or chills [ n ];          Dental: poor dentition[n  ]; Last Dentist visit:   Eye : blurred vision [  ]; diplopia [   ]; vision changes [  ];  Amaurosis fugax[  ]; Resp: cough [n  ];  wheezing[n  ];  hemoptysis[ n ]; shortness of breath[ y ]; paroxysmal nocturnal dyspnea[n  ]; dyspnea on exertion[y  ]; or orthopnea[n  ];  GI:  gallstones[n  ], vomiting[n  ];  dysphagia[ n ]; melena[  n];  hematochezia [ n ]; heartburn[ n ];   Hx of  Colonoscopy[last year  ]; GU: kidney stones [n  ]; hematuria[n  ];   dysuria [ n ];  nocturia[n  ];  history of     obstruction [  ]; urinary frequency [  ]             Skin: rash, swelling[  ];, hair loss[  ];  peripheral edema[  ];  or itching[  ]; Musculosketetal: myalgias[n  ];  joint swelling[n  ];  joint erythema[  ];  joint pain[ y ];  back pain[  ];  Heme/Lymph: bruising[ n ];  bleeding[ n ];  anemia[ n ];  Neuro: TIA[ n ];  headachesn[ n;  stroke[ n ];  vertigo[n  ];  seizures[ n ];   paresthesias[  ];  difficulty walking[  ];  Psych:depression[n  ]; anxiety[n  ];  Endocrine: diabetes[  y];  thyroid dysfunction[  n];  Immunizations: Flu up to date Blue.Reese  ]; Pneumococcal up to date [ y  ];  Other:  Physical Exam: BP (!) 145/86   Pulse (!) 57   Temp 97.7 F (36.5 C)   Resp 20   Wt 206 lb (93.4 kg)   SpO2 97%   BMI 30.42 kg/m   PHYSICAL EXAMINATION: General appearance: alert, cooperative, appears stated age and no distress Head: Normocephalic, without obvious abnormality, atraumatic Neck: no adenopathy, no carotid bruit, no JVD, supple, symmetrical, trachea midline and thyroid not enlarged, symmetric, no tenderness/mass/nodules Lymph nodes: Cervical, supraclavicular, and axillary nodes normal. Resp: clear to auscultation  bilaterally Back: symmetric, no curvature. ROM normal. No CVA tenderness. Cardio: regular rate and rhythm, S1, S2 normal, no murmur, click, rub or gallop GI: soft, non-tender; bowel sounds normal; no masses,  no organomegaly Extremities: extremities normal, atraumatic, no cyanosis or edema Neurologic: Grossly normal Patient appears to have adequate vein for bypass in both lower extremities has palpable DP and PT pulses bilaterally Dressing is on the right groin without palpable femoral false aneurysm he does have bruising along the inner aspect of his right thigh  Diagnostic Studies & Laboratory data:     Recent Radiology Findings:   Dg Chest 2 View  Result Date: 06/11/2017 CLINICAL DATA:  Chest pressure EXAM: CHEST - 2 VIEW COMPARISON:  07/01/2016 FINDINGS: The heart size and mediastinal contours are within normal limits. Both lungs are clear. The visualized skeletal structures are unremarkable. IMPRESSION: No active cardiopulmonary disease. Electronically Signed   By: Kathreen Devoid   On: 06/11/2017 11:50     I have independently reviewed the above radiologic studies.  Recent Lab Findings: Lab Results  Component Value Date   WBC 7.5 06/22/2017   HGB 16.1 06/22/2017   HCT 44.6 06/22/2017   PLT 103 (L) 06/22/2017   GLUCOSE 211 (H) 06/22/2017   ALT 41 06/22/2017   AST 23 06/22/2017   NA 136 06/22/2017   K 4.2 06/22/2017   CL 105  06/22/2017   CREATININE 0.63 06/22/2017   BUN 19 06/22/2017   CO2 21 (L) 06/22/2017   INR 1.13 06/22/2017   HGBA1C 6.6 (H) 06/22/2017   CATH: Procedures   LEFT HEART CATH AND CORONARY ANGIOGRAPHY  Conclusion     Dist LM lesion is 60% stenosed.  Ost LAD lesion is 75% stenosed.  Ost 1st Diag lesion is 99% stenosed.  Ost Cx lesion is 70% stenosed.  Prox LAD lesion is 70% stenosed.  Mid RCA lesion is 60% stenosed.  The left ventricular systolic function is normal.  LV end diastolic pressure is normal.  There is no aortic valve stenosis.  There is no mitral valve stenosis and no mitral valve prolapse evident.   LM 60% stenosis LAD 75% ostial stenosis, 70% mid D1 99% ostial stenosis LCx 70% ostial Stenosis RCA-60% mid stenosis  LV-normal ef.  Referral to cardiothoracic surgery for consideration for cabg.      I have independently reviewed the above  cath films and reviewed the findings with the  patient .     Indications   Chest pain, unspecified type [R07.9 (ICD-10-CM)]   [place] [address] [place]  Procedure: Exercise Myocardial Perfusion Imaging ONE day procedure  Indication: Chest pain on exertion, unspecified Plan: NM myocardial perfusion SPECT multiple (stress  and rest), ECG stress test only  Ordering Physician:   Dr. Bartholome Bill   Clinical History: 66 y.o. year old male Vitals: Height: 31 inWeight: 209 lb Cardiac risk factors include:  Hyperlipidemia, Diabetes and HTN    Procedure: The patient performed treadmill exercise using a Bruce protocol for 6:24  minutes. The exercise test was stopped due to fatigue.Blood pressure  response was normal. The patient developed symptoms other than fatigue  during the procedure; specific symptoms included SEVERE FATIGUE AND SOB  WITH CHEST PAIN AND LEG STIFFNESS.  Rest HR:  58bpm Rest BP: 126/59mmHg Max HR: 125bpm Max BP: 150/71mmHg Mets: 7.60 % MAX HR: 80%  Stress Test Administered by: Oswald Hillock, CMA  ECG Interpretation: Rest ZOX:WRUEAV sinus rhythm, none  Stress ERX:VQMGQ tachycardia, 1-2 mm of ST depression inferiorly with  stress. Recovery QPY:PPJKDT sinus rhythm ECG Interpretation:positive, ECG evidence of ischemia.   Administrations This Visit  technetium Tc71m sestamibi (CARDIOLITE) injection 26.71 millicurie  Admin Date 24/58/0998 Action Given Dose 33.82 millicurie Route Intravenous Administered By Ane Payment, CNMT     technetium Tc48m sestamibi (CARDIOLITE) injection 50.53 millicurie  Admin Date 97/67/3419 Action Given Dose 37.90 millicurie Route Intravenous Administered By Ane Payment CNMT   Patient:    Jessejames, Steelman MR #:       [id number] Study Date: 06/22/2017 Gender:     M Age:        20 Height:     175.3 cm Weight:     95.2 kg BSA:        2.18 m^2 Pt. Status: Room:   ATTENDING    Lanelle Bal MD  Mildred MD  REFERRING    Lanelle Bal MD  SONOGRAPHER  Dustin Flock, RCS  PERFORMING   [facility], Outpatient  cc:  ------------------------------------------------------------------- LV EF: 65% -   70%  ------------------------------------------------------------------- Indications:      CAD of native vessels 414.01.  ------------------------------------------------------------------- History:   PMH:   Coronary artery disease.  Risk factors: Hypertension. Diabetes mellitus.  ------------------------------------------------------------------- Study Conclusions  - Left ventricle: The cavity size was normal. Systolic function was   vigorous. The estimated ejection fraction was in the range of 65%   to 70%. Wall motion was normal; there were no regional wall   motion abnormalities. Left ventricular diastolic function was not   evaluated. - Aortic  valve: Sclerosis without stenosis. There was trivial   regurgitation. - Mitral valve: There was mild regurgitation. - Right ventricle: The cavity size was normal. Wall thickness was   normal. Systolic function was normal. - Right atrium: The atrium was normal in size. - Tricuspid valve: There was mild regurgitation. - Pulmonary arteries: Systolic pressure was within the normal   range. - Inferior vena cava: The vessel was normal in size. - Pericardium, extracardiac: There was no pericardial effusion.  ------------------------------------------------------------------- Study data:  No prior study was available for comparison.  Study status:  Routine.  Procedure:  The patient reported no pain pre or post test. Transthoracic echocardiography. Image quality was adequate.  Study completion:  There were no complications. Transthoracic echocardiography.  M-mode, complete 2D, spectral Doppler, and color Doppler.  Birthdate:  Patient birthdate: 01-15-1951.  Age:  Patient is 67 yr old.  Sex:  Gender: male. BMI: 31 kg/m^2.  Blood pressure:     134/84  Patient status: Outpatient.  Study date:  Study date: 06/22/2017. Study time: 12:05 PM.  Location:  [facility] laboratory.  -------------------------------------------------------------------  ------------------------------------------------------------------- Left ventricle:  The cavity size was normal. Systolic function was vigorous. The estimated ejection fraction was in the range of 65% to 70%. Wall motion was normal; there were no regional wall motion abnormalities. The transmitral flow pattern was normal. The deceleration time of the early transmitral flow velocity was normal. The pulmonary vein flow pattern was normal. The tissue Doppler parameters were normal. Left ventricular diastolic function was not evaluated.  ------------------------------------------------------------------- Aortic valve:   Trileaflet; mildly thickened, mildly  calcified leaflets. Mobility was not restricted. Sclerosis without stenosis. Doppler:  Transvalvular velocity was within the normal range. There was no stenosis. There was trivial regurgitation.  ------------------------------------------------------------------- Aorta:  Aortic root: The aortic root was normal in size.  ------------------------------------------------------------------- Mitral valve:   Structurally normal valve.  Mobility was not restricted.  Doppler:  Transvalvular velocity was within the normal range. There was no evidence for stenosis. There was mild regurgitation.  ------------------------------------------------------------------- Left atrium:  The atrium was normal in size.  ------------------------------------------------------------------- Right ventricle:  The cavity size was normal. Wall thickness was normal. Systolic function was normal.  ------------------------------------------------------------------- Pulmonic valve:    Structurally normal valve.   Cusp separation was normal.  Doppler:  Transvalvular velocity was within the normal range. There was no evidence for stenosis. There was no regurgitation.  ------------------------------------------------------------------- Tricuspid valve:   Structurally normal valve.    Doppler: Transvalvular velocity was within the normal range. There was mild regurgitation.  ------------------------------------------------------------------- Pulmonary artery:   The main pulmonary artery was normal-sized. Systolic pressure was within the normal range.  ------------------------------------------------------------------- Right atrium:  The atrium was normal in size.  ------------------------------------------------------------------- Pericardium:  There was no pericardial effusion.  ------------------------------------------------------------------- Systemic veins: Inferior vena cava: The vessel was  normal in size.  ------------------------------------------------------------------- Measurements   Left ventricle                           Value        Reference  LV ID, ED, PLAX chordal          (H)     53    mm     43 - 52  LV ID, ES, PLAX chordal                  35    mm     23 - 38  LV fx shortening, PLAX chordal           34    %      >=29  LV PW thickness, ED                      10    mm     ----------  IVS/LV PW ratio, ED                      1.1          <=1.3  Stroke volume, 2D                        81    ml     ----------  Stroke volume/bsa, 2D                    37    ml/m^2 ----------  LV e&', lateral                           9.36  cm/s   ----------  LV E/e&', lateral                         6.42         ----------  LV e&', medial                            6.42  cm/s   ----------  LV E/e&', medial                          9.36         ----------  LV e&', average  7.89  cm/s   ----------  LV E/e&', average                         7.62         ----------    Ventricular septum                       Value        Reference  IVS thickness, ED                        11    mm     ----------    LVOT                                     Value        Reference  LVOT ID, S                               20    mm     ----------  LVOT area                                3.14  cm^2   ----------  LVOT peak velocity, S                    131   cm/s   ----------  LVOT mean velocity, S                    70    cm/s   ----------  LVOT VTI, S                              25.8  cm     ----------  LVOT peak gradient, S                    7     mm Hg  ----------    Aortic valve                             Value        Reference  Aortic regurg pressure half-time         562   ms     ----------    Aorta                                    Value        Reference  Aortic root ID, ED                       31    mm     ----------    Left atrium                               Value        Reference  LA ID, A-P, ES  41    mm     ----------  LA ID/bsa, A-P                           1.88  cm/m^2 <=2.2  LA volume, S                             51.4  ml     ----------  LA volume/bsa, S                         23.6  ml/m^2 ----------  LA volume, ES, 1-p A4C                   59.4  ml     ----------  LA volume/bsa, ES, 1-p A4C               27.2  ml/m^2 ----------  LA volume, ES, 1-p A2C                   39.1  ml     ----------  LA volume/bsa, ES, 1-p A2C               17.9  ml/m^2 ----------    Mitral valve                             Value        Reference  Mitral E-wave peak velocity              60.1  cm/s   ----------  Mitral A-wave peak velocity              71.8  cm/s   ----------  Mitral deceleration time         (H)     257   ms     150 - 230  Mitral E/A ratio, peak                   0.8          ----------    Pulmonary arteries                       Value        Reference  PA pressure, S, DP                       17    mm Hg  <=30    Tricuspid valve                          Value        Reference  Tricuspid regurg peak velocity           190   cm/s   ----------  Tricuspid peak RV-RA gradient            14    mm Hg  ----------    Right atrium                             Value        Reference  RA ID, S-I, ES, A4C  45.8  mm     34 - 49  RA area, ES, A4C                         13.1  cm^2   8.3 - 19.5  RA volume, ES, A/L                       30.7  ml     ----------  RA volume/bsa, ES, A/L                   14.1  ml/m^2 ----------    Systemic veins                           Value        Reference  Estimated CVP                            3     mm Hg  ----------    Right ventricle                          Value        Reference  RV ID, minor axis, ED, A4C base          18    mm     ----------  TAPSE                                    26.7  mm     ----------  RV pressure, S, DP                       17     mm Hg  <=30  RV s&', lateral, S                        9.14  cm/s   ----------  Legend: (L)  and  (H)  mark values outside specified reference range.  ------------------------------------------------------------------- Prepared and Electronically Authenticated by  Ena Dawley, M.D. 2019-03-19T13:24:37  Assessment / Plan:   1/Three-vessel coronary artery disease with a component of left main obstruction with new onset of angina and positive stress test and diabetes.  With this combination of features I recommended to the patient that we proceed with coronary artery bypass grafting.  Risks and options have been discussed with the patient in detail and he wishes to proceed as soon as possible.   2/diabetes with complication of coronary occlusive disease with recent hemoglobin A1c 6.3   The goals risks and alternatives of the planned surgical procedure CABG  have been discussed with the patient in detail. The risks of the procedure including death, infection, stroke, myocardial infarction, bleeding, blood transfusion have all been discussed specifically.  I have quoted [name] a 2 % of perioperative mortality and a complication rate as high as 30 %. The patient's questions have been answered.Hawley YEIDEN FRENKEL is willing  to proceed with the planned procedure. Grace Isaac MD      [address] [place] [address] Office [phone]   Beeper [phone]  06/23/2017 7:46 AM

## 2017-06-23 NOTE — Anesthesia Procedure Notes (Signed)
Procedure Name: Intubation Date/Time: 06/23/2017 9:07 AM Performed by: Neldon Newport, CRNA Pre-anesthesia Checklist: Timeout performed, Patient being monitored, Suction available, Emergency Drugs available and Patient identified Patient Re-evaluated:Patient Re-evaluated prior to induction Oxygen Delivery Method: Circle system utilized Preoxygenation: Pre-oxygenation with 100% oxygen Induction Type: IV induction Ventilation: Mask ventilation without difficulty Laryngoscope Size: Mac and 3 Grade View: Grade I Tube size: 8.0 (DL performed by Burnett Corrente, SRNA) mm Number of attempts: 1 Placement Confirmation: breath sounds checked- equal and bilateral,  positive ETCO2 and ETT inserted through vocal cords under direct vision Secured at: 23 cm Tube secured with: Tape Dental Injury: Teeth and Oropharynx as per pre-operative assessment

## 2017-06-23 NOTE — Anesthesia Procedure Notes (Signed)
Central Venous Catheter Insertion Performed by: Oleta Mouse, MD, anesthesiologist Start/End3/20/2019 7:59 AM, 06/23/2017 8:06 AM Patient location: Pre-op. Preanesthetic checklist: patient identified, IV checked, site marked, risks and benefits discussed, surgical consent, monitors and equipment checked, pre-op evaluation, timeout performed and anesthesia consent Lidocaine 1% used for infiltration and patient sedated Hand hygiene performed  and maximum sterile barriers used  Catheter size: 8.5 Fr Sheath introducer Procedure performed using ultrasound guided technique. Ultrasound Notes:anatomy identified, needle tip was noted to be adjacent to the nerve/plexus identified, no ultrasound evidence of intravascular and/or intraneural injection and image(s) printed for medical record Attempts: 1 Following insertion, line sutured, dressing applied and Biopatch. Post procedure assessment: blood return through all ports, free fluid flow and no air  Patient tolerated the procedure well with no immediate complications.

## 2017-06-23 NOTE — Brief Op Note (Signed)
06/23/2017  12:53 PM  PATIENT:  Phillip Le  67 y.o. male  PRE-OPERATIVE DIAGNOSIS:  CAD  POST-OPERATIVE DIAGNOSIS:  CAD  PROCEDURE:  Procedure(s):  CORONARY ARTERY BYPASS GRAFTING (CABG) x 4 -LIMA to LAD -SVG to OM -SVG to DIAGONAL -SVG to DISTAL RCA  ENDOSCOPIC HARVEST GREATER SAPHENOUS VEIN -Right Leg   TRANSESOPHAGEAL ECHOCARDIOGRAM (TEE) (N/A)  SURGEON:  Surgeon(s) and Role:    * Grace Isaac, MD - Primary  PHYSICIAN ASSISTANT: Ellwood Handler PA-C  ANESTHESIA:   general  EBL:  350 mL   BLOOD ADMINISTERED: CELLSAVER  DRAINS: Left Pleural Chest tubes, Mediastinal Chest Drains   LOCAL MEDICATIONS USED:  NONE  SPECIMEN:  No Specimen  DISPOSITION OF SPECIMEN:  N/A  COUNTS:  YES  TOURNIQUET:  * No tourniquets in log *  DICTATION: .Dragon Dictation  PLAN OF CARE: Admit to inpatient   PATIENT DISPOSITION:  ICU - intubated and hemodynamically stable.   Delay start of Pharmacological VTE agent (>24hrs) due to surgical blood loss or risk of bleeding: yes

## 2017-06-23 NOTE — Anesthesia Procedure Notes (Signed)
Arterial Line Insertion Start/End3/20/2019 7:50 AM, 06/23/2017 8:01 AM Performed by: Neldon Newport, CRNA, CRNA  Patient location: Pre-op. Lidocaine 1% used for infiltration and patient sedated Left, radial was placed Catheter size: 20 G Hand hygiene performed , maximum sterile barriers used  and Seldinger technique used Allen's test indicative of satisfactory collateral circulation Attempts: 1 Procedure performed without using ultrasound guided technique. Following insertion, dressing applied and Biopatch. Post procedure assessment: unchanged  Patient tolerated the procedure well with no immediate complications.

## 2017-06-23 NOTE — Procedures (Signed)
Extubation Procedure Note  Patient Details:   Name: Phillip Le DOB: Apr 22, 1950 MRN: [id number]   Airway Documentation:     Evaluation  O2 sats: stable throughout Complications: No apparent complications Patient did tolerate procedure well. Bilateral Breath Sounds: Clear, Diminished   Yes Pt extubated to 4L [place] per rapid wean protocol. NARD VSS, Rn at bedside. VC NIF 0.9/-20 Pt able to speak, No stridor appreciated at this time. Will cont to monitor.   Lottie Rater 06/23/2017, 6:36 PM

## 2017-06-23 NOTE — Anesthesia Postprocedure Evaluation (Signed)
Anesthesia Post Note  Patient: Phillip Le  Procedure(s) Performed: CORONARY ARTERY BYPASS GRAFTING (CABG) x 4, on pump,LIMA to LAD, SVG to OM, SVG to DIAGONAL, SVG to DISTAL RCA, using left internal mammary artery and right greater saphenous vein harvested endoscopically (N/A Chest) TRANSESOPHAGEAL ECHOCARDIOGRAM (TEE) (N/A )     Patient location during evaluation: SICU Anesthesia Type: General Level of consciousness: patient remains intubated per anesthesia plan Pain management: pain level controlled Vital Signs Assessment: post-procedure vital signs reviewed and stable Respiratory status: patient remains intubated per anesthesia plan Anesthetic complications: no    Last Vitals:  Vitals:   06/23/17 0632 06/23/17 1510  BP: (!) 145/86   Pulse: (!) 57   Resp: 20   Temp: 36.5 C   SpO2: 97% 100%    Last Pain:  Vitals:   06/23/17 0655  PainSc: 0-No pain                 Tiffini Blacksher

## 2017-06-23 NOTE — Anesthesia Preprocedure Evaluation (Signed)
Anesthesia Evaluation  Patient identified by MRN, date of birth, ID band Patient awake    Reviewed: Allergy & Precautions, NPO status , Patient's Chart, lab work & pertinent test results  Airway Mallampati: II  TM Distance: >3 FB     Dental   Pulmonary sleep apnea , former smoker,    breath sounds clear to auscultation       Cardiovascular hypertension, + CAD  (-) DOE + dysrhythmias  Rhythm:Regular Rate:Normal     Neuro/Psych    GI/Hepatic negative GI ROS, Neg liver ROS,   Endo/Other  diabetes  Renal/GU negative Renal ROS     Musculoskeletal   Abdominal   Peds  Hematology   Anesthesia Other Findings   Reproductive/Obstetrics                             Anesthesia Physical Anesthesia Plan  ASA: III  Anesthesia Plan: General   Post-op Pain Management:    Induction: Intravenous  PONV Risk Score and Plan: 2 and Midazolam and Treatment may vary due to age or medical condition  Airway Management Planned: Oral ETT  Additional Equipment: Arterial line, PA Cath, TEE and Ultrasound Guidance Line Placement  Intra-op Plan:   Post-operative Plan: Post-operative intubation/ventilation  Informed Consent: I have reviewed the patients History and Physical, chart, labs and discussed the procedure including the risks, benefits and alternatives for the proposed anesthesia with the patient or authorized representative who has indicated his/her understanding and acceptance.   Dental advisory given  Plan Discussed with: CRNA and Anesthesiologist  Anesthesia Plan Comments:         Anesthesia Quick Evaluation

## 2017-06-23 NOTE — Progress Notes (Signed)
  Echocardiogram Echocardiogram Transesophageal has been performed.  Phillip Le 06/23/2017, 10:35 AM

## 2017-06-23 NOTE — Anesthesia Procedure Notes (Signed)
Central Venous Catheter Insertion Performed by: Oleta Mouse, MD, anesthesiologist Start/End3/20/2019 7:59 AM, 06/23/2017 8:06 AM Patient location: Pre-op. Preanesthetic checklist: patient identified, IV checked, site marked, risks and benefits discussed, surgical consent, monitors and equipment checked, pre-op evaluation, timeout performed and anesthesia consent Hand hygiene performed  and maximum sterile barriers used  PA cath was placed.Swan type:thermodilution Procedure performed without using ultrasound guided technique. Attempts: 1 Patient tolerated the procedure well with no immediate complications.

## 2017-06-24 ENCOUNTER — Inpatient Hospital Stay (HOSPITAL_COMMUNITY): Payer: BLUE CROSS/BLUE SHIELD

## 2017-06-24 ENCOUNTER — Encounter (HOSPITAL_COMMUNITY): Payer: Self-pay | Admitting: *Deleted

## 2017-06-24 ENCOUNTER — Other Ambulatory Visit: Payer: Self-pay

## 2017-06-24 LAB — POCT I-STAT, CHEM 8
BUN: 16 mg/dL (ref 6–20)
Calcium, Ion: 1.22 mmol/L (ref 1.15–1.40)
Chloride: 95 mmol/L — ABNORMAL LOW (ref 101–111)
Creatinine, Ser: 0.5 mg/dL — ABNORMAL LOW (ref 0.61–1.24)
Glucose, Bld: 155 mg/dL — ABNORMAL HIGH (ref 65–99)
HCT: 39 % (ref 39.0–52.0)
Hemoglobin: 13.3 g/dL (ref 13.0–17.0)
Potassium: 4.1 mmol/L (ref 3.5–5.1)
Sodium: 136 mmol/L (ref 135–145)
TCO2: 29 mmol/L (ref 22–32)

## 2017-06-24 LAB — GLUCOSE, CAPILLARY
Glucose-Capillary: 100 mg/dL — ABNORMAL HIGH (ref 65–99)
Glucose-Capillary: 103 mg/dL — ABNORMAL HIGH (ref 65–99)
Glucose-Capillary: 104 mg/dL — ABNORMAL HIGH (ref 65–99)
Glucose-Capillary: 106 mg/dL — ABNORMAL HIGH (ref 65–99)
Glucose-Capillary: 107 mg/dL — ABNORMAL HIGH (ref 65–99)
Glucose-Capillary: 111 mg/dL — ABNORMAL HIGH (ref 65–99)
Glucose-Capillary: 113 mg/dL — ABNORMAL HIGH (ref 65–99)
Glucose-Capillary: 116 mg/dL — ABNORMAL HIGH (ref 65–99)
Glucose-Capillary: 118 mg/dL — ABNORMAL HIGH (ref 65–99)
Glucose-Capillary: 120 mg/dL — ABNORMAL HIGH (ref 65–99)
Glucose-Capillary: 121 mg/dL — ABNORMAL HIGH (ref 65–99)
Glucose-Capillary: 140 mg/dL — ABNORMAL HIGH (ref 65–99)
Glucose-Capillary: 151 mg/dL — ABNORMAL HIGH (ref 65–99)
Glucose-Capillary: 157 mg/dL — ABNORMAL HIGH (ref 65–99)
Glucose-Capillary: 167 mg/dL — ABNORMAL HIGH (ref 65–99)

## 2017-06-24 LAB — BASIC METABOLIC PANEL
Anion gap: 8 (ref 5–15)
BUN: 13 mg/dL (ref 6–20)
CALCIUM: 8.2 mg/dL — AB (ref 8.9–10.3)
CO2: 25 mmol/L (ref 22–32)
CREATININE: 0.56 mg/dL — AB (ref 0.61–1.24)
Chloride: 103 mmol/L (ref 101–111)
GFR calc Af Amer: >60 mL/min (ref >60)
GLUCOSE: 108 mg/dL — AB (ref 65–99)
POTASSIUM: 4.3 mmol/L (ref 3.5–5.1)
Sodium: 136 mmol/L (ref 135–145)

## 2017-06-24 LAB — CBC
HCT: 39.5 % (ref 39.0–52.0)
HCT: 39.1 % (ref 39.0–52.0)
Hemoglobin: 13.4 g/dL (ref 13.0–17.0)
Hemoglobin: 13.7 g/dL (ref 13.0–17.0)
MCH: 30.5 pg (ref 26.0–34.0)
MCH: 31.6 pg (ref 26.0–34.0)
MCHC: 33.9 g/dL (ref 30.0–36.0)
MCHC: 35 g/dL (ref 30.0–36.0)
MCV: 89.8 fL (ref 78.0–100.0)
MCV: 90.1 fL (ref 78.0–100.0)
PLATELETS: 116 10*3/uL — AB (ref 150–400)
Platelets: 137 10*3/uL — ABNORMAL LOW (ref 150–400)
RBC: 4.4 MIL/uL (ref 4.22–5.81)
RBC: 4.34 MIL/uL (ref 4.22–5.81)
RDW: 12.9 % (ref 11.5–15.5)
RDW: 13.5 % (ref 11.5–15.5)
WBC: 14.6 10*3/uL — ABNORMAL HIGH (ref 4.0–10.5)
WBC: 12.6 10*3/uL — AB (ref 4.0–10.5)

## 2017-06-24 LAB — CREATININE, SERUM
Creatinine, Ser: 0.59 mg/dL — ABNORMAL LOW (ref 0.61–1.24)
GFR calc Af Amer: >60 mL/min (ref >60)
GFR calc non Af Amer: >60 mL/min (ref >60)

## 2017-06-24 LAB — MAGNESIUM
Magnesium: 2.3 mg/dL (ref 1.7–2.4)
Magnesium: 2.2 mg/dL (ref 1.7–2.4)

## 2017-06-24 MED ORDER — ALUM & MAG HYDROXIDE-SIMETH 200-200-20 MG/5ML PO SUSP
15.0000 mL | ORAL | Status: DC | PRN
  Administered 2017-06-24 – 2017-06-25 (×2): 15 mL via ORAL
  Filled 2017-06-24 (×2): qty 30

## 2017-06-24 MED ORDER — INSULIN ASPART 100 UNIT/ML ~~LOC~~ SOLN
0.0000 [IU] | SUBCUTANEOUS | Status: DC
  Administered 2017-06-24 (×2): 2 [IU] via SUBCUTANEOUS
  Administered 2017-06-25: 4 [IU] via SUBCUTANEOUS
  Administered 2017-06-25: 8 [IU] via SUBCUTANEOUS
  Administered 2017-06-25: 2 [IU] via SUBCUTANEOUS
  Administered 2017-06-25 (×2): 4 [IU] via SUBCUTANEOUS
  Administered 2017-06-25 – 2017-06-26 (×2): 2 [IU] via SUBCUTANEOUS

## 2017-06-24 MED ORDER — SODIUM CHLORIDE 0.9% FLUSH
10.0000 mL | Freq: Two times a day (BID) | INTRAVENOUS | Status: DC
  Administered 2017-06-24: 10 mL

## 2017-06-24 MED ORDER — FENTANYL CITRATE (PF) 100 MCG/2ML IJ SOLN
25.0000 ug | INTRAMUSCULAR | Status: DC | PRN
  Administered 2017-06-24 (×2): 25 ug via INTRAVENOUS
  Filled 2017-06-24 (×2): qty 2

## 2017-06-24 MED ORDER — ENOXAPARIN SODIUM 40 MG/0.4ML ~~LOC~~ SOLN
40.0000 mg | Freq: Every day | SUBCUTANEOUS | Status: DC
  Administered 2017-06-24 – 2017-06-26 (×3): 40 mg via SUBCUTANEOUS
  Filled 2017-06-24 (×3): qty 0.4

## 2017-06-24 MED ORDER — MENTHOL 3 MG MT LOZG
1.0000 | LOZENGE | OROMUCOSAL | Status: DC | PRN
  Administered 2017-06-24 (×2): 3 mg via ORAL
  Filled 2017-06-24: qty 9

## 2017-06-24 MED ORDER — KETOROLAC TROMETHAMINE 15 MG/ML IJ SOLN
15.0000 mg | Freq: Four times a day (QID) | INTRAMUSCULAR | Status: DC | PRN
  Administered 2017-06-24 (×2): 15 mg via INTRAVENOUS
  Filled 2017-06-24 (×2): qty 1

## 2017-06-24 MED ORDER — SODIUM CHLORIDE 0.9% FLUSH: 10.0000 mL | INTRAVENOUS | Status: DC | PRN

## 2017-06-24 MED ORDER — INSULIN DETEMIR 100 UNIT/ML ~~LOC~~ SOLN
15.0000 [IU] | Freq: Every day | SUBCUTANEOUS | Status: DC
  Administered 2017-06-25 – 2017-06-26 (×2): 15 [IU] via SUBCUTANEOUS
  Filled 2017-06-24 (×2): qty 0.15

## 2017-06-24 MED ORDER — CHLORHEXIDINE GLUCONATE CLOTH 2 % EX PADS
6.0000 | MEDICATED_PAD | Freq: Every day | CUTANEOUS | Status: DC
  Administered 2017-06-24 – 2017-06-26 (×3): 6 via TOPICAL

## 2017-06-24 MED ORDER — INSULIN DETEMIR 100 UNIT/ML ~~LOC~~ SOLN
15.0000 [IU] | Freq: Once | SUBCUTANEOUS | Status: DC
  Filled 2017-06-24: qty 0.15

## 2017-06-24 MED ORDER — INSULIN DETEMIR 100 UNIT/ML ~~LOC~~ SOLN
15.0000 [IU] | Freq: Once | SUBCUTANEOUS | Status: AC
  Administered 2017-06-24: 15 [IU] via SUBCUTANEOUS
  Filled 2017-06-24: qty 0.15

## 2017-06-24 NOTE — Plan of Care (Signed)
  Problem: Clinical Measurements: Goal: Respiratory complications will improve Outcome: Progressing Goal: Cardiovascular complication will be avoided Outcome: Progressing   Problem: Activity: Goal: Risk for activity intolerance will decrease Outcome: Progressing Note:  Dangled, stood beside bed this AM. Will sit in chair and attempt to ambulate today.   Problem: Cardiac: Goal: Hemodynamic stability will improve Outcome: Progressing   Problem: Respiratory: Goal: Respiratory status will improve Outcome: Progressing   Problem: Pain Managment: Goal: General experience of comfort will improve Outcome: Not Progressing Note:  Pain remains uncontrolled; attempting to find pain medication regimen that's effective.

## 2017-06-24 NOTE — Progress Notes (Signed)
Late Entry  Patient's Prevena incisional wound vac has not been functioning properly all shift. Dr. Servando Snare aware this morning. Ellwood Handler, PA changed dressing with RN at 12pm without any improvement. Stated she spoke to Dr. Cyndia Bent at 1400 and he stated it was ok to remove Prevena dressing. Patient assisted to chair after this conversation, removed prevena incisional wound vac dressing, bathed, painted incision with betadine, and covered with dry gauze. Incision is clean, dry, and well approximated. No s/s infection.  Joellen Jersey, RN

## 2017-06-24 NOTE — Progress Notes (Addendum)
[facility] DAILY ICU PROGRESS NOTE                   [address]            [place] [address]          [phone]   1 Day Post-Op Procedure(s) (LRB): CORONARY ARTERY BYPASS GRAFTING (CABG) x 4, on pump,LIMA to LAD, SVG to OM, SVG to DIAGONAL, SVG to DISTAL RCA, using left internal mammary artery and right greater saphenous vein harvested endoscopically (N/A) TRANSESOPHAGEAL ECHOCARDIOGRAM (TEE) (N/A)  Total Length of Stay:  LOS: 1 day   Subjective:  Patient states he is miserable.  He is having a lot of pain, not really responding to medications.  He has chronic dry mouth and states he is starting to get sore and is requesting lozenges.  Denies N/V  Per nursing his Pravena dressing is not functioning properly.  Objective: Vital signs in last 24 hours: Temp:  [95 F (35 C)-100.6 F (38.1 C)] 98.4 F (36.9 C) (03/21 0738) Pulse Rate:  [69-89] 70 (03/21 0700) Cardiac Rhythm: Normal sinus rhythm (03/21 0400) Resp:  [9-20] 9 (03/21 0700) BP: (94-117)/(64-88) 117/69 (03/21 0700) SpO2:  [94 %-100 %] 94 % (03/21 0700) Arterial Line BP: (99-134)/(50-68) 112/56 (03/21 0700) FiO2 (%):  [40 %-50 %] 40 % (03/20 1750) Weight:  [211 lb 10.3 oz (96 kg)] 211 lb 10.3 oz (96 kg) (03/21 0500)  Filed Weights   06/23/17 0632 06/24/17 0500  Weight: 206 lb (93.4 kg) 211 lb 10.3 oz (96 kg)    Weight change: 5 lb 10.3 oz (2.559 kg)   Hemodynamic parameters for last 24 hours: PAP: (22-32)/(6-18) 29/10 CO:  [5.1 L/min-6.8 L/min] 5.9 L/min CI:  [2.4 L/min/m2-3.3 L/min/m2] 2.8 L/min/m2  Intake/Output from previous day: 03/20 0701 - 03/21 0700 In: 7364.8 [I.V.:4414.8; Blood:600; IV Piggyback:2350] Out: 0092 [Urine:4470; Blood:1825; Chest Tube:379]  Current Meds: Scheduled Meds: . acetaminophen  1,000 mg Oral Q6H   Or  . acetaminophen (TYLENOL) oral liquid 160 mg/5 mL  1,000 mg Per Tube Q6H  . aspirin EC  325 mg Oral Daily   Or  . aspirin  324 mg Per Tube Daily  . bisacodyl   10 mg Oral Daily   Or  . bisacodyl  10 mg Rectal Daily  . docusate sodium  200 mg Oral Daily  . enoxaparin (LOVENOX) injection  40 mg Subcutaneous QHS  . insulin aspart  0-24 Units Subcutaneous Q4H  . insulin detemir  15 Units Subcutaneous Once  . [START ON 06/25/2017] insulin detemir  15 Units Subcutaneous Daily  . insulin regular  0-10 Units Intravenous TID WC  . mouth rinse  15 mL Mouth Rinse BID  . metoCLOPramide (REGLAN) injection  10 mg Intravenous Q6H  . metoprolol tartrate  12.5 mg Oral BID   Or  . metoprolol tartrate  12.5 mg Per Tube BID  . [START ON 06/25/2017] pantoprazole  40 mg Oral Daily  . pravastatin  10 mg Oral Q2000  . sodium chloride flush  3 mL Intravenous Q12H   Continuous Infusions: . sodium chloride 20 mL/hr at 06/24/17 0700  . sodium chloride    . sodium chloride    . albumin human Stopped (06/23/17 1530)  .  ceFAZolin (ANCEF) IV Stopped (06/24/17 0430)  . dexmedetomidine (PRECEDEX) IV infusion Stopped (06/24/17 0500)  . famotidine (PEPCID) IV    . insulin (NOVOLIN-R) infusion 3.5 Units/hr (06/24/17 0700)  . lactated ringers    .  lactated ringers 20 mL/hr at 06/24/17 0700  . lactated ringers Stopped (06/24/17 0500)  . nitroGLYCERIN Stopped (06/23/17 1832)  . phenylephrine (NEO-SYNEPHRINE) Adult infusion Stopped (06/24/17 0700)   PRN Meds:.sodium chloride, albumin human, albuterol, fentaNYL (SUBLIMAZE) injection, lactated ringers, menthol-cetylpyridinium, metoprolol tartrate, midazolam, ondansetron (ZOFRAN) IV, oxyCODONE, sodium chloride flush, traMADol  General appearance: alert, cooperative and no distress Heart: regular rate and rhythm Lungs: clear to auscultation bilaterally Abdomen: soft, non-tender; bowel sounds normal; no masses,  no organomegaly Extremities: edema trace Wound: clean and dry, pravena in place on sternotomy with leak present, RLE extensive ecchymosis from cath and [facility]  Lab Results: CBC: Recent Labs    06/23/17 2108  06/23/17 2110 06/24/17 0411  WBC 13.6*  --  14.6*  HGB 14.2 13.6 13.4  HCT 39.7 40.0 39.5  PLT 103*  --  116*   BMET:  Recent Labs    06/22/17 1350  06/23/17 2110 06/24/17 0411  NA 136   < > 139 136  K 4.2   < > 4.1 4.3  CL 105   < > 101 103  CO2 21*  --   --  25  GLUCOSE 211*   < > 198* 108*  BUN 19   < > 13 13  CREATININE 0.63   < > 0.50* 0.56*  CALCIUM 9.4  --   --  8.2*   < > = values in this interval not displayed.    CMET: Lab Results  Component Value Date   WBC 14.6 (H) 06/24/2017   HGB 13.4 06/24/2017   HCT 39.5 06/24/2017   PLT 116 (L) 06/24/2017   GLUCOSE 108 (H) 06/24/2017   ALT 41 06/22/2017   AST 23 06/22/2017   NA 136 06/24/2017   K 4.3 06/24/2017   CL 103 06/24/2017   CREATININE 0.56 (L) 06/24/2017   BUN 13 06/24/2017   CO2 25 06/24/2017   INR 1.48 06/23/2017   HGBA1C 6.6 (H) 06/22/2017      PT/INR:  Recent Labs    06/23/17 1521  LABPROT 17.8*  INR 1.48   Radiology: Dg Chest Port 1 View  Result Date: 06/23/2017 CLINICAL DATA:  Status post CABG. EXAM: PORTABLE CHEST 1 VIEW COMPARISON:  06/11/2017 FINDINGS: Sequelae of interval CABG are identified. Endotracheal tube terminates at the clavicular heads. A right jugular Swan-Ganz catheter terminates over the right main pulmonary artery. An enteric tube courses into the left upper abdomen with tip not imaged. A mediastinal drain and left chest tube rib present. There is accentuation of the cardiomediastinal silhouette by portable AP technique and low lung volumes, and there is mild central pulmonary vascular congestion. Mild left basilar atelectasis is noted. There may be a trace left pleural effusion. No pneumothorax is identified. IMPRESSION: 1. Interval CABG with support devices as above.  No pneumothorax. 2. Low lung volumes with left basilar atelectasis and possible trace left pleural effusion. Electronically Signed   By: Logan Bores M.D.   On: 06/23/2017 16:27     Assessment/Plan: S/P  Procedure(s) (LRB): CORONARY ARTERY BYPASS GRAFTING (CABG) x 4, on pump,LIMA to LAD, SVG to OM, SVG to DIAGONAL, SVG to DISTAL RCA, using left internal mammary artery and right greater saphenous vein harvested endoscopically (N/A) TRANSESOPHAGEAL ECHOCARDIOGRAM (TEE) (N/A)  1. CV- NSR, EKG reviewed and is NSR with PVCs, off all drips- start low dose BB today 2. Pulm- wean oxygen as tolerated, start use of IS, CXR with atelectasis bilaterally, will d/c MT tubes, leave pleural tubes for now  3. Renal- creatinine WNL, K is 4.3, weight is mildly elevated- will hold off on Lasix for now 4. Expected post operative blood loss anemia, mild Hgb at 13.4 5. Expected post operative thrombocytopenia- 116, will monitor 6. DM- remains on insulin drip, will start Levemir and wean drip per protocol 7. Chronic Dry mouth- cepacol ordered per patient request 8. Pain control- patient with 6 doses of Morphine in addition to Oxy and Tramadol overnight, without relief... Will d/c IV Morphine, try Fentanyl, could use Toradol if remains an issues, but will defer to Dr. Servando Snare 9. Dispo- patient stable, maintaining NSR, wean insulin drip, POD #1 progression orders   Phillip Le 06/24/2017 7:54 AM   With dm, avoid high dose  toradol due to risk of renal inj, but will give limited dose this am Stable off drips  mobilize today I have seen and examined Phillip Le and agree with the above assessment  and plan.  Phillip Isaac MD Beeper [phone] Office [phone] 06/24/2017 8:33 AM

## 2017-06-24 NOTE — Progress Notes (Signed)
Patient ID: Phillip Le, male   DOB: [birth year], 67 y.o.   MRN: [id number] [facility] Evening Rounds  Hemodynamically stable in sinus rhythm.  sats 95%  Good urine output  BMET    Component Value Date/Time   NA 136 06/24/2017 1628   NA 137 12/02/2012 0110   K 4.1 06/24/2017 1628   K 4.4 12/02/2012 0110   CL 95 (L) 06/24/2017 1628   CL 101 12/02/2012 0110   CO2 25 06/24/2017 0411   CO2 29 12/02/2012 0110   GLUCOSE 155 (H) 06/24/2017 1628   GLUCOSE 128 (H) 12/02/2012 0110   BUN 16 06/24/2017 1628   BUN 21 (H) 12/02/2012 0110   CREATININE 0.50 (L) 06/24/2017 1628   CREATININE 1.03 12/02/2012 0110   CALCIUM 8.2 (L) 06/24/2017 0411   CALCIUM 9.9 12/02/2012 0110   GFRNONAA >60 06/24/2017 1620   GFRNONAA >60 12/02/2012 0110   GFRAA >60 06/24/2017 1620   GFRAA >60 12/02/2012 0110   CBC    Component Value Date/Time   WBC 12.6 (H) 06/24/2017 1620   RBC 4.34 06/24/2017 1620   HGB 13.3 06/24/2017 1628   HGB 16.9 12/02/2012 0110   HCT 39.0 06/24/2017 1628   HCT 47.0 12/02/2012 0110   PLT 137 (L) 06/24/2017 1620   PLT 138 (L) 12/02/2012 0110   MCV 90.1 06/24/2017 1620   MCV 89 12/02/2012 0110   MCH 31.6 06/24/2017 1620   MCHC 35.0 06/24/2017 1620   RDW 13.5 06/24/2017 1620   RDW 13.5 12/02/2012 0110

## 2017-06-24 NOTE — Discharge Summary (Signed)
Physician Discharge Summary  Patient ID: [name]: [id number] DOB/AGE: 66-Jun-1952 66 y.o.  Admit date: 06/23/2017 Discharge date: 06/27/2017  Admission Diagnoses:  Patient Active Problem List   Diagnosis Date Noted  . Intermittent palpitations 02/09/2014  . Type II diabetes mellitus ([place]) 09/15/2013  . Hypertension 09/15/2013  . Other and unspecified hyperlipidemia 09/15/2013  . Sleep apnea 09/15/2013   Discharge Diagnoses:   Patient Active Problem List   Diagnosis Date Noted  . S/P CABG x 4 06/23/2017  . Intermittent palpitations 02/09/2014  . Type II diabetes mellitus ([place]) 09/15/2013  . Hypertension 09/15/2013  . Other and unspecified hyperlipidemia 09/15/2013  . Sleep apnea 09/15/2013   Discharged Condition: good  History of Present Illness:  Mr. Phillip Le is a 67 yo male with known history of DM, Hypertension, and OSA.  The patient developed chest pain with radiation into both of his arms.  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  He presented the [facility] ED with similar complaints, but was ultimately discharged home.  He was further evaluated by Dr. Ubaldo Glassing who ordered a nuclear stress test which showed evidence of ischemia.  Cardiac catheterization was indicated and revealed multivessel CAD.  It was felt coronary bypass grafting would be indicated and he was referred to Dr. Servando Snare for evaluation.  He was evaluated on 06/21/2017 at which time Dr. Servando Snare evaluated the patient and was in agreement the patient would benefit from bypass procedure.  The risks and benefits of the procedure were explained to the patient and he was agreeable to proceed.    Hospital Course:   Phillip Le presented to [facility] on 06/23/2017.  He was taken to the operating room and underwent CABG x 4 utilizing LIMA to LAD, SVG to OM, SVG to Diagonal, and SVG to distal RCA.  He also underwent endoscopic harvest of greater saphenous vein from his  right leg.  He tolerated the procedure without difficulty and was taken to the SICU in stable condition.  He was extubated the evening of surgery.  During his stay in the SICU the patient did not require Inotropic support.  His SWAN ganz catheter, arterial line, and mediastinal chest tube was removed without difficulty on POD #1.  Follow up CXR remained stable.  His left pleural chest tube was removed on POD #2.  He developed Atrial Fibrillation with RVR and was treated with IV Amiodarone per the protocol.  His Lopressor dose was also increased as HR and BP allowed.  He converted to NSR and was transferred to the telemetry unit on 06/26/2017.  He continues to make good progress.  He is maintaining NSR.  He is hypertensive and was restarted on his home Altace at a reduced dose.  His pacing wires were removed without difficulty prior to discharge.  He is ambulating independently.  His pain is well controlled.  He is medically stable for discharge home today.  Significant Diagnostic Studies: angiography:    Dist LM lesion is 60% stenosed.  Ost LAD lesion is 75% stenosed.  Ost 1st Diag lesion is 99% stenosed.  Ost Cx lesion is 70% stenosed.  Prox LAD lesion is 70% stenosed.  Mid RCA lesion is 60% stenosed.  The left ventricular systolic function is normal.  LV end diastolic pressure is normal.  There is no aortic valve stenosis.  There is no mitral valve stenosis and no mitral valve prolapse evident.   LM 60% stenosis LAD 75% ostial  stenosis, 70% mid D1 99% ostial stenosis LCx 70% ostial Stenosis RCA-60% mid stenosis  Treatments: surgery:   CORONARY ARTERY BYPASS GRAFTING (CABG) x 4 -LIMA to LAD -SVG to OM -SVG to DIAGONAL -SVG to DISTAL RCA  ENDOSCOPIC HARVEST GREATER SAPHENOUS VEIN -Right Leg   TRANSESOPHAGEAL ECHOCARDIOGRAM (TEE) (N/A)  Disposition: Discharge disposition: 01-Home or Self Care     Home  Discharge Medications:  The patient has been discharged  on:   1.Beta Blocker:  Yes [ x  ]                              No   [   ]                              If No, reason:  2.Ace Inhibitor/ARB: Yes [ x  ]                                     No  [    ]                                     If No, reason:  3.Statin:   Yes [ x  ]                  No  [   ]                  If No, reason:  4.Ecasa:  Yes  [ x  ]                  No   [   ]                  If No, reason:     Discharge Instructions    Amb Referral to Cardiac Rehabilitation   Complete by:  As directed    Diagnosis:  CABG   CABG X ___:  4     Allergies as of 06/24/2017   No Known Allergies    Allergies as of 06/27/2017   No Known Allergies     Medication List    STOP taking these medications   ibuprofen 200 MG tablet Commonly known as:  ADVIL,MOTRIN   nitroGLYCERIN 0.4 mg/hr patch Commonly known as:  NITRODUR - Dosed in mg/24 hr     TAKE these medications   acetaminophen 500 MG tablet Commonly known as:  TYLENOL Take 2 tablets (1,000 mg total) by mouth every 6 (six) hours as needed for mild pain or fever.   albuterol 108 (90 Base) MCG/ACT inhaler Commonly known as:  PROVENTIL HFA;VENTOLIN HFA Inhale 1-2 puffs into the lungs every 6 (six) hours as needed for wheezing.   amiodarone 200 MG tablet Commonly known as:  PACERONE Take 1 tablet (200 mg total) by mouth 2 (two) times daily after a meal. For 7 Days, then decrease to 200 mg daiy   aspirin 325 MG EC tablet Take 1 tablet (325 mg total) by mouth daily. Start taking on:  06/28/2017 What changed:    medication strength  how much to take   benzonatate 200 MG capsule Commonly known as:  TESSALON Take 1 capsule (200 mg total)  by mouth 3 (three) times daily as needed.   furosemide 40 MG tablet Commonly known as:  LASIX Take 1 tablet (40 mg total) by mouth daily for 7 days. Start taking on:  06/28/2017   ketoconazole 2 % cream Commonly known as:  NIZORAL Apply 1 application topically daily as  needed for irritation.   metFORMIN 500 MG tablet Commonly known as:  GLUCOPHAGE Take 500 mg by mouth 2 (two) times daily with a meal.   metoprolol tartrate 25 MG tablet Commonly known as:  LOPRESSOR Take 25 mg by mouth 2 (two) times daily.   oxyCODONE 5 MG immediate release tablet Commonly known as:  Oxy IR/ROXICODONE Take 1 tablet (5 mg total) by mouth every 4 (four) hours as needed for severe pain.   potassium chloride SA 20 MEQ tablet Commonly known as:  K-DUR,KLOR-CON Take 1 tablet (20 mEq total) by mouth daily for 7 days. Start taking on:  06/28/2017   pravastatin 10 MG tablet Commonly known as:  PRAVACHOL Take 10 mg by mouth daily.   ramipril 5 MG capsule Commonly known as:  ALTACE Take 1 capsule (5 mg total) by mouth daily. Start taking on:  06/28/2017 What changed:    medication strength  how much to take          Follow-up Information    Grace Isaac, MD Follow up on 08/09/2017.   Specialty:  Cardiothoracic Surgery Why:  Appointment is at 1:30, please get CXR at 1:00 at [place] located on first floor of our office building Contact information: [address] [place] [address] [phone]        Teodoro Spray, MD Follow up on 07/09/2017.   Specialty:  Cardiology Why:  Appointment is at 10:15 Contact information: [place] [address] [phone]           Signed: Ellwood Handler 06/27/2017, 10:38 AM

## 2017-06-25 ENCOUNTER — Other Ambulatory Visit: Payer: Self-pay

## 2017-06-25 ENCOUNTER — Inpatient Hospital Stay (HOSPITAL_COMMUNITY): Payer: BLUE CROSS/BLUE SHIELD

## 2017-06-25 LAB — GLUCOSE, CAPILLARY
Glucose-Capillary: 129 mg/dL — ABNORMAL HIGH (ref 65–99)
Glucose-Capillary: 164 mg/dL — ABNORMAL HIGH (ref 65–99)
Glucose-Capillary: 202 mg/dL — ABNORMAL HIGH (ref 65–99)
Glucose-Capillary: 144 mg/dL — ABNORMAL HIGH (ref 65–99)
Glucose-Capillary: 173 mg/dL — ABNORMAL HIGH (ref 65–99)
Glucose-Capillary: 117 mg/dL — ABNORMAL HIGH (ref 65–99)

## 2017-06-25 LAB — CBC
HCT: 37.9 % — ABNORMAL LOW (ref 39.0–52.0)
Hemoglobin: 12.7 g/dL — ABNORMAL LOW (ref 13.0–17.0)
MCH: 30.8 pg (ref 26.0–34.0)
MCHC: 33.5 g/dL (ref 30.0–36.0)
MCV: 91.8 fL (ref 78.0–100.0)
Platelets: 86 10*3/uL — ABNORMAL LOW (ref 150–400)
RBC: 4.13 MIL/uL — ABNORMAL LOW (ref 4.22–5.81)
RDW: 13.3 % (ref 11.5–15.5)
WBC: 11.8 10*3/uL — AB (ref 4.0–10.5)

## 2017-06-25 LAB — BASIC METABOLIC PANEL
ANION GAP: 8 (ref 5–15)
BUN: 12 mg/dL (ref 6–20)
CALCIUM: 8.8 mg/dL — AB (ref 8.9–10.3)
CO2: 28 mmol/L (ref 22–32)
Chloride: 98 mmol/L — ABNORMAL LOW (ref 101–111)
Creatinine, Ser: 0.6 mg/dL — ABNORMAL LOW (ref 0.61–1.24)
GFR calc Af Amer: >60 mL/min (ref >60)
GLUCOSE: 138 mg/dL — AB (ref 65–99)
POTASSIUM: 5.9 mmol/L — AB (ref 3.5–5.1)
SODIUM: 134 mmol/L — AB (ref 135–145)

## 2017-06-25 LAB — POCT I-STAT, CHEM 8
BUN: 12 mg/dL (ref 6–20)
Calcium, Ion: 1.18 mmol/L (ref 1.15–1.40)
Chloride: 94 mmol/L — ABNORMAL LOW (ref 101–111)
Creatinine, Ser: 0.5 mg/dL — ABNORMAL LOW (ref 0.61–1.24)
Glucose, Bld: 211 mg/dL — ABNORMAL HIGH (ref 65–99)
HCT: 38 % — ABNORMAL LOW (ref 39.0–52.0)
Hemoglobin: 12.9 g/dL — ABNORMAL LOW (ref 13.0–17.0)
Potassium: 4 mmol/L (ref 3.5–5.1)
Sodium: 135 mmol/L (ref 135–145)
TCO2: 26 mmol/L (ref 22–32)

## 2017-06-25 LAB — TSH: TSH: 2.643 u[IU]/mL (ref 0.350–4.500)

## 2017-06-25 MED ORDER — AMIODARONE LOAD VIA INFUSION
150.0000 mg | Freq: Once | INTRAVENOUS | Status: AC
  Administered 2017-06-25: 150 mg via INTRAVENOUS
  Filled 2017-06-25: qty 83.34

## 2017-06-25 MED ORDER — AMIODARONE HCL IN DEXTROSE 360-4.14 MG/200ML-% IV SOLN
30.0000 mg/h | INTRAVENOUS | Status: DC
  Administered 2017-06-25 – 2017-06-26 (×2): 30 mg/h via INTRAVENOUS
  Filled 2017-06-25 (×2): qty 200

## 2017-06-25 MED ORDER — AMIODARONE HCL IN DEXTROSE 360-4.14 MG/200ML-% IV SOLN
INTRAVENOUS | Status: AC
  Filled 2017-06-25: qty 200

## 2017-06-25 MED ORDER — AMIODARONE HCL IN DEXTROSE 360-4.14 MG/200ML-% IV SOLN
60.0000 mg/h | INTRAVENOUS | Status: AC
  Administered 2017-06-25 (×2): 60 mg/h via INTRAVENOUS

## 2017-06-25 MED ORDER — POTASSIUM CHLORIDE CRYS ER 20 MEQ PO TBCR
20.0000 meq | EXTENDED_RELEASE_TABLET | Freq: Every day | ORAL | Status: DC
  Administered 2017-06-25 – 2017-06-27 (×3): 20 meq via ORAL
  Filled 2017-06-25 (×3): qty 1

## 2017-06-25 MED ORDER — FUROSEMIDE 40 MG PO TABS
40.0000 mg | ORAL_TABLET | Freq: Every day | ORAL | Status: DC
  Administered 2017-06-25 – 2017-06-27 (×3): 40 mg via ORAL
  Filled 2017-06-25 (×3): qty 1

## 2017-06-25 MED ORDER — METOPROLOL TARTRATE 25 MG PO TABS
25.0000 mg | ORAL_TABLET | Freq: Two times a day (BID) | ORAL | Status: DC
  Administered 2017-06-25 – 2017-06-27 (×5): 25 mg via ORAL
  Filled 2017-06-25 (×6): qty 1

## 2017-06-25 MED ORDER — AMIODARONE HCL IN DEXTROSE 360-4.14 MG/200ML-% IV SOLN
INTRAVENOUS | Status: AC
  Administered 2017-06-25: 06:00:00
  Filled 2017-06-25: qty 200

## 2017-06-25 NOTE — Progress Notes (Addendum)
[address]       [place] [address]             [phone]      2 Days Post-Op Procedure(s) (LRB): CORONARY ARTERY BYPASS GRAFTING (CABG) x 4, on pump,LIMA to LAD, SVG to OM, SVG to DIAGONAL, SVG to DISTAL RCA, using left internal mammary artery and right greater saphenous vein harvested endoscopically (N/A) TRANSESOPHAGEAL ECHOCARDIOGRAM (TEE) (N/A)   Subjective:  No new complaints.  Feeling better this morning.  Still not taking deep breaths as it hurts to breath deep.  + ambulation  Objective: Vital signs in last 24 hours: Temp:  [98.3 F (36.8 C)-99.8 F (37.7 C)] 98.3 F (36.8 C) (03/22 0313) Pulse Rate:  [71-107] 102 (03/22 0600) Cardiac Rhythm: Normal sinus rhythm (03/22 0000) Resp:  [10-33] 18 (03/22 0600) BP: (111-155)/(63-94) 115/70 (03/22 0600) SpO2:  [91 %-98 %] 95 % (03/22 0600) Arterial Line BP: (120-130)/(54-60) 130/60 (03/21 0900) Weight:  [210 lb 1.6 oz (95.3 kg)] 210 lb 1.6 oz (95.3 kg) (03/22 0600)  Hemodynamic parameters for last 24 hours: PAP: (26-28)/(8-10) 28/10 CO:  [5.4 L/min] 5.4 L/min CI:  [2.6 L/min/m2] 2.6 L/min/m2  Intake/Output from previous day: 03/21 0701 - 03/22 0700 In: 554.1 [P.O.:240; I.V.:214.1; IV Piggyback:100] Out: 2415 [Urine:2215; Chest Tube:200]  General appearance: alert, cooperative and no distress Heart: irregularly irregular rhythm Lungs: clear to auscultation bilaterally Abdomen: soft, non-tender; bowel sounds normal; no masses,  no organomegaly Extremities: edema trace Wound: clean and dry, ecchymosis of right leg from cath, [facility]   Lab Results: Recent Labs    06/24/17 1620  06/25/17 0443 06/25/17 0545  WBC 12.6*  --  11.8*  --   HGB 13.7   < > 12.7* 12.9*  HCT 39.1   < > 37.9* 38.0*  PLT 137*  --  PENDING  --    < > = values in this interval not displayed.   BMET:  Recent Labs    06/24/17 0411  06/25/17 0443 06/25/17 0545  NA 136   < > 134* 135  K 4.3   < > 5.9* 4.0  CL 103    < > 98* 94*  CO2 25  --  28  --   GLUCOSE 108*   < > 138* 211*  BUN 13   < > 12 12  CREATININE 0.56*   < > 0.60* 0.50*  CALCIUM 8.2*  --  8.8*  --    < > = values in this interval not displayed.    PT/INR:  Recent Labs    06/23/17 1521  LABPROT 17.8*  INR 1.48   ABG    Component Value Date/Time   PHART 7.321 (L) 06/23/2017 1925   HCO3 23.5 06/23/2017 1925   TCO2 26 06/25/2017 0545   ACIDBASEDEF 2.0 06/23/2017 1925   O2SAT 97.0 06/23/2017 1925   CBG (last 3)  Recent Labs    06/24/17 1943 06/24/17 2322 06/25/17 0236  GLUCAP 151* 167* 129*    Assessment/Plan: S/P Procedure(s) (LRB): CORONARY ARTERY BYPASS GRAFTING (CABG) x 4, on pump,LIMA to LAD, SVG to OM, SVG to DIAGONAL, SVG to DISTAL RCA, using left internal mammary artery and right greater saphenous vein harvested endoscopically (N/A) TRANSESOPHAGEAL ECHOCARDIOGRAM (TEE) (N/A)  1. CV- Atrial Fibrillation developed this morning-on Amiodarone protocol, will increase Lopressor to 25 mg BID 2. Pulm- no acute issues, weaning oxygen as tolerated, CXR with atelectasis bilaterally, d/c chest tube today 3. Renal- creatinine WNL,  weight is mildly elevated, will start Lasix 40 mg daily, supplement K, d/c foley 4. Expected post operative blood loss anemia, stable at 12.9 5. Thrombocytopenia- stable, current result pending 6. Dispo- patient stable, increase BB, continue Amiodarone for Atrial Fibrillation, will convert to oral regimen once patient converts to NSR, will check TSH level, start diuretics.. Will remain in ICU today, if converts to NSR can possibly transfer tomorrow   LOS: 2 days    Ellwood Handler 06/25/2017 Overall improved with pain, ambulation Iv amio thru central line for afib Ok to d/c chest tube, foley Keep in ICU patient examined and medical record reviewed,agree with above note. Tharon Aquas Trigt III 06/25/2017

## 2017-06-25 NOTE — Discharge Instructions (Signed)

## 2017-06-25 NOTE — Progress Notes (Signed)
  Amiodarone Drug - Drug Interaction Consult Note  Recommendations: Monitor HR and for signs of muscle pain/weakness. Amiodarone is metabolized by the cytochrome P450 system and therefore has the potential to cause many drug interactions. Amiodarone has an average plasma half-life of 50 days (range 20 to 100 days).   There is potential for drug interactions to occur several weeks or months after stopping treatment and the onset of drug interactions may be slow after initiating amiodarone.   [x]  Statins: Increased risk of myopathy. Simvastatin- restrict dose to 20mg  daily. Other statins: counsel patients to report any muscle pain or weakness immediately.  []  Anticoagulants: Amiodarone can increase anticoagulant effect. Consider warfarin dose reduction. Patients should be monitored closely and the dose of anticoagulant altered accordingly, remembering that amiodarone levels take several weeks to stabilize.  []  Antiepileptics: Amiodarone can increase plasma concentration of phenytoin, the dose should be reduced. Note that small changes in phenytoin dose can result in large changes in levels. Monitor patient and counsel on signs of toxicity.  [x]  Beta blockers: increased risk of bradycardia, AV block and myocardial depression. Sotalol - avoid concomitant use.  []   Calcium channel blockers (diltiazem and verapamil): increased risk of bradycardia, AV block and myocardial depression.  []   Cyclosporine: Amiodarone increases levels of cyclosporine. Reduced dose of cyclosporine is recommended.  []  Digoxin dose should be halved when amiodarone is started.  []  Diuretics: increased risk of cardiotoxicity if hypokalemia occurs.  []  Oral hypoglycemic agents (glyburide, glipizide, glimepiride): increased risk of hypoglycemia. Patient's glucose levels should be monitored closely when initiating amiodarone therapy.   []  Drugs that prolong the QT interval:  Torsades de pointes risk may be increased with  concurrent use - avoid if possible.  Monitor QTc, also keep magnesium/potassium WNL if concurrent therapy can't be avoided. Marland Kitchen Antibiotics: e.g. fluoroquinolones, erythromycin. . Antiarrhythmics: e.g. quinidine, procainamide, disopyramide, sotalol. . Antipsychotics: e.g. phenothiazines, haloperidol.  . Lithium, tricyclic antidepressants, and methadone.  Thank you,  Wynona Neat, PharmD, BCPS 06/25/2017 7:17 AM

## 2017-06-25 NOTE — Progress Notes (Signed)
[facility] BRIEF SICU PROGRESS NOTE  2 Days Post-Op  S/P Procedure(s) (LRB): CORONARY ARTERY BYPASS GRAFTING (CABG) x 4, on pump,LIMA to LAD, SVG to OM, SVG to DIAGONAL, SVG to DISTAL RCA, using left internal mammary artery and right greater saphenous vein harvested endoscopically (N/A) TRANSESOPHAGEAL ECHOCARDIOGRAM (TEE) (N/A)   Stable day  Plan: Continue current plan  Rexene Alberts, MD 06/25/2017 6:55 PM

## 2017-06-26 ENCOUNTER — Inpatient Hospital Stay (HOSPITAL_COMMUNITY): Payer: BLUE CROSS/BLUE SHIELD

## 2017-06-26 LAB — GLUCOSE, CAPILLARY
Glucose-Capillary: 158 mg/dL — ABNORMAL HIGH (ref 65–99)
Glucose-Capillary: 110 mg/dL — ABNORMAL HIGH (ref 65–99)
Glucose-Capillary: 140 mg/dL — ABNORMAL HIGH (ref 65–99)
Glucose-Capillary: 121 mg/dL — ABNORMAL HIGH (ref 65–99)

## 2017-06-26 LAB — BASIC METABOLIC PANEL
Anion gap: 10 (ref 5–15)
BUN: 10 mg/dL (ref 6–20)
CO2: 27 mmol/L (ref 22–32)
Calcium: 8.5 mg/dL — ABNORMAL LOW (ref 8.9–10.3)
Chloride: 98 mmol/L — ABNORMAL LOW (ref 101–111)
Creatinine, Ser: 0.61 mg/dL (ref 0.61–1.24)
GFR calc Af Amer: >60 mL/min (ref >60)
GFR calc non Af Amer: >60 mL/min (ref >60)
Glucose, Bld: 176 mg/dL — ABNORMAL HIGH (ref 65–99)
Potassium: 5.1 mmol/L (ref 3.5–5.1)
Sodium: 135 mmol/L (ref 135–145)

## 2017-06-26 LAB — CBC
HCT: 38.2 % — ABNORMAL LOW (ref 39.0–52.0)
Hemoglobin: 12.8 g/dL — ABNORMAL LOW (ref 13.0–17.0)
MCH: 30.7 pg (ref 26.0–34.0)
MCHC: 33.5 g/dL (ref 30.0–36.0)
MCV: 91.6 fL (ref 78.0–100.0)
Platelets: 85 10*3/uL — ABNORMAL LOW (ref 150–400)
RBC: 4.17 MIL/uL — ABNORMAL LOW (ref 4.22–5.81)
RDW: 13.2 % (ref 11.5–15.5)
WBC: 9.3 10*3/uL (ref 4.0–10.5)

## 2017-06-26 MED ORDER — INSULIN ASPART 100 UNIT/ML ~~LOC~~ SOLN
0.0000 [IU] | Freq: Three times a day (TID) | SUBCUTANEOUS | Status: DC
  Administered 2017-06-26 – 2017-06-27 (×3): 2 [IU] via SUBCUTANEOUS

## 2017-06-26 MED ORDER — MOVING RIGHT ALONG BOOK
Freq: Once | Status: AC
  Administered 2017-06-26: 10:00:00
  Filled 2017-06-26: qty 1

## 2017-06-26 MED ORDER — AMIODARONE HCL 200 MG PO TABS
200.0000 mg | ORAL_TABLET | Freq: Two times a day (BID) | ORAL | Status: DC
  Administered 2017-06-26 – 2017-06-27 (×3): 200 mg via ORAL
  Filled 2017-06-26 (×3): qty 1

## 2017-06-26 NOTE — Progress Notes (Signed)
[address]       [place] [address]             [phone]        CARDIOTHORACIC SURGERY PROGRESS NOTE   R3 Days Post-Op Procedure(s) (LRB): CORONARY ARTERY BYPASS GRAFTING (CABG) x 4, on pump,LIMA to LAD, SVG to OM, SVG to DIAGONAL, SVG to DISTAL RCA, using left internal mammary artery and right greater saphenous vein harvested endoscopically (N/A) TRANSESOPHAGEAL ECHOCARDIOGRAM (TEE) (N/A)  Subjective: No complaints.  Feels well.  No pain, SOB.  Hungry for breakfast.  Ambulated once.  Objective: Vital signs: BP Readings from Last 1 Encounters:  06/26/17 139/84   Pulse Readings from Last 1 Encounters:  06/26/17 75   Resp Readings from Last 1 Encounters:  06/26/17 (!) 25   Temp Readings from Last 1 Encounters:  06/26/17 98.4 F (36.9 C) (Oral)    Hemodynamics:    Physical Exam:  Rhythm:   sinus  Breath sounds: clear  Heart sounds:  RRR  Incisions:  Clean and dry  Abdomen:  Soft, non-distended, non-tender  Extremities:  Warm, well-perfused    Intake/Output from previous day: 03/22 0701 - 03/23 0700 In: 2292.9 [P.O.:1560; I.V.:732.9] Out: 2345 [Urine:2275; Chest Tube:70] Intake/Output this shift: Total I/O In: 33.4 [I.V.:33.4] Out: 125 [Urine:125]  Lab Results:  CBC: Recent Labs    06/25/17 0443 06/25/17 0545 06/26/17 0344  WBC 11.8*  --  9.3  HGB 12.7* 12.9* 12.8*  HCT 37.9* 38.0* 38.2*  PLT 86*  --  85*    BMET:  Recent Labs    06/25/17 0443 06/25/17 0545 06/26/17 0344  NA 134* 135 135  K 5.9* 4.0 5.1  CL 98* 94* 98*  CO2 28  --  27  GLUCOSE 138* 211* 176*  BUN 12 12 10   CREATININE 0.60* 0.50* 0.61  CALCIUM 8.8*  --  8.5*     PT/INR:   Recent Labs    06/23/17 1521  LABPROT 17.8*  INR 1.48    CBG (last 3)  Recent Labs    06/25/17 2317 06/26/17 0349 06/26/17 0752  GLUCAP 117* 158* 110*    ABG    Component Value Date/Time   PHART 7.321 (L) 06/23/2017 1925   PCO2ART 46.1 06/23/2017 1925   PO2ART 98.0 06/23/2017 1925   HCO3 23.5 06/23/2017 1925   TCO2 26 06/25/2017 0545   ACIDBASEDEF 2.0 06/23/2017 1925   O2SAT 97.0 06/23/2017 1925    CXR: CHEST - 2 VIEW  COMPARISON:  Chest x-ray from yesterday.  FINDINGS: Interval removal of the left-sided chest tube. Unchanged right internal jugular sheath. Stable cardiomediastinal silhouette status post CABG. Normal pulmonary vascularity. Left basilar atelectasis and small left pleural effusion are unchanged. No pneumothorax. The right lung is clear. No acute osseous abnormality.  IMPRESSION: 1. Interval left-sided chest tube removal.  No pneumothorax. 2. Unchanged left lower lobe atelectasis with adjacent small left pleural effusion.   Electronically Signed   By: Titus Dubin M.D.   On: 06/26/2017 08:19  Assessment/Plan: S/P Procedure(s) (LRB): CORONARY ARTERY BYPASS GRAFTING (CABG) x 4, on pump,LIMA to LAD, SVG to OM, SVG to DIAGONAL, SVG to DISTAL RCA, using left internal mammary artery and right greater saphenous vein harvested endoscopically (N/A) TRANSESOPHAGEAL ECHOCARDIOGRAM (TEE) (N/A)  Doing well POD3 Maintaining NSR w/ stable BP, no further episodes Afib Breathing comfortably on room air Ambulating well w/ minimal assistance   Routine care  Change amiodarone to oral  Transfer tele  Anticipate d/c soon if rhythm stable  Rexene Alberts, MD 06/26/2017 9:33 AM

## 2017-06-26 NOTE — Progress Notes (Signed)
[id number] Patient awaiting transfer to step-down unit later today. [facility] education completed with patient including sternal precautions, IS use, risk factor modification, and activity progression. "Safe Movement After Heart Surgery" handout, heart healthy and diabetic diet and exercise guidelines given. Pt verbalizes understanding of instructions given. Discussed phase 2 cardiac rehab and pt is interested in the program at [facility], referral sent.

## 2017-06-27 LAB — CBC
HCT: 40.5 % (ref 39.0–52.0)
Hemoglobin: 14.1 g/dL (ref 13.0–17.0)
MCH: 31.5 pg (ref 26.0–34.0)
MCHC: 34.8 g/dL (ref 30.0–36.0)
MCV: 90.6 fL (ref 78.0–100.0)
Platelets: 145 10*3/uL — ABNORMAL LOW (ref 150–400)
RBC: 4.47 MIL/uL (ref 4.22–5.81)
RDW: 13 % (ref 11.5–15.5)
WBC: 8.8 10*3/uL (ref 4.0–10.5)

## 2017-06-27 LAB — BASIC METABOLIC PANEL
ANION GAP: 9 (ref 5–15)
BUN: 13 mg/dL (ref 6–20)
CALCIUM: 9.3 mg/dL (ref 8.9–10.3)
CO2: 25 mmol/L (ref 22–32)
Chloride: 104 mmol/L (ref 101–111)
Creatinine, Ser: 0.71 mg/dL (ref 0.61–1.24)
GFR calc Af Amer: >60 mL/min (ref >60)
GLUCOSE: 152 mg/dL — AB (ref 65–99)
Potassium: 3.9 mmol/L (ref 3.5–5.1)
Sodium: 138 mmol/L (ref 135–145)

## 2017-06-27 LAB — GLUCOSE, CAPILLARY
Glucose-Capillary: 150 mg/dL — ABNORMAL HIGH (ref 65–99)
Glucose-Capillary: 134 mg/dL — ABNORMAL HIGH (ref 65–99)

## 2017-06-27 MED ORDER — FUROSEMIDE 40 MG PO TABS: 40.0000 mg | ORAL_TABLET | Freq: Every day | ORAL | 0 refills | Status: DC

## 2017-06-27 MED ORDER — OXYCODONE HCL 5 MG PO TABS: 5.0000 mg | ORAL_TABLET | ORAL | 0 refills | Status: DC | PRN

## 2017-06-27 MED ORDER — ACETAMINOPHEN 500 MG PO TABS: 1000.0000 mg | ORAL_TABLET | Freq: Four times a day (QID) | ORAL | 0 refills | Status: DC | PRN

## 2017-06-27 MED ORDER — AMIODARONE HCL 200 MG PO TABS: 200.0000 mg | ORAL_TABLET | Freq: Two times a day (BID) | ORAL | 1 refills | Status: DC

## 2017-06-27 MED ORDER — POTASSIUM CHLORIDE CRYS ER 20 MEQ PO TBCR: 20.0000 meq | EXTENDED_RELEASE_TABLET | Freq: Every day | ORAL | 0 refills | Status: DC

## 2017-06-27 MED ORDER — ASPIRIN 325 MG PO TBEC: 325.0000 mg | DELAYED_RELEASE_TABLET | Freq: Every day | ORAL | 0 refills | Status: DC

## 2017-06-27 MED ORDER — RAMIPRIL 5 MG PO CAPS: 5.0000 mg | ORAL_CAPSULE | Freq: Every day | ORAL | 3 refills | Status: AC

## 2017-06-27 MED ORDER — RAMIPRIL 5 MG PO CAPS
5.0000 mg | ORAL_CAPSULE | Freq: Every day | ORAL | Status: DC
  Administered 2017-06-27: 5 mg via ORAL
  Filled 2017-06-27: qty 1

## 2017-06-27 NOTE — Progress Notes (Signed)
Per patient and wife, pt ambulated three times yesterday, 2 X at [facility] and one time at [place] independently to bathroom multiples time throughout the night to void in the toilet commode and tolerated well.

## 2017-06-27 NOTE — Progress Notes (Addendum)
      [address]       [place] [address]             [phone]      4 Days Post-Op Procedure(s) (LRB): CORONARY ARTERY BYPASS GRAFTING (CABG) x 4, on pump,LIMA to LAD, SVG to OM, SVG to DIAGONAL, SVG to DISTAL RCA, using left internal mammary artery and right greater saphenous vein harvested endoscopically (N/A) TRANSESOPHAGEAL ECHOCARDIOGRAM (TEE) (N/A)   Subjective:  No new complaints.  He feels great and really wants to go home today.  He states he thinks he can rest better and he has a great family support system. + ambulation independently.  Objective: Vital signs in last 24 hours: Temp:  [98.2 F (36.8 C)-99.1 F (37.3 C)] 98.3 F (36.8 C) (03/24 0428) Pulse Rate:  [74-89] 85 (03/24 0428) Cardiac Rhythm: Normal sinus rhythm (03/24 0707) Resp:  [17-23] 20 (03/24 0428) BP: (121-145)/(74-85) 145/77 (03/24 0428) SpO2:  [91 %-94 %] 92 % (03/24 0428) Weight:  [202 lb 6.4 oz (91.8 kg)-205 lb 0.4 oz (93 kg)] 202 lb 6.4 oz (91.8 kg) (03/24 0428)  Intake/Output from previous day: 03/23 0701 - 03/24 0700 In: 258.4 [P.O.:225; I.V.:33.4] Out: 125 [Urine:125]  General appearance: alert, cooperative and no distress Heart: regular rate and rhythm Lungs: clear to auscultation bilaterally Abdomen: soft, non-tender; bowel sounds normal; no masses,  no organomegaly Extremities: edema trace Wound: clean and dry  Lab Results: Recent Labs    06/26/17 0344 06/27/17 0253  WBC 9.3 8.8  HGB 12.8* 14.1  HCT 38.2* 40.5  PLT 85* 145*   BMET:  Recent Labs    06/26/17 0344 06/27/17 0253  NA 135 138  K 5.1 3.9  CL 98* 104  CO2 27 25  GLUCOSE 176* 152*  BUN 10 13  CREATININE 0.61 0.71  CALCIUM 8.5* 9.3    PT/INR: No results for input(s): LABPROT, INR in the last 72 hours. ABG    Component Value Date/Time   PHART 7.321 (L) 06/23/2017 1925   HCO3 23.5 06/23/2017 1925   TCO2 26 06/25/2017 0545   ACIDBASEDEF 2.0 06/23/2017 1925   O2SAT 97.0 06/23/2017  1925   CBG (last 3)  Recent Labs    06/26/17 1153 06/26/17 2143 06/27/17 0620  GLUCAP 140* 121* 150*    Assessment/Plan: S/P Procedure(s) (LRB): CORONARY ARTERY BYPASS GRAFTING (CABG) x 4, on pump,LIMA to LAD, SVG to OM, SVG to DIAGONAL, SVG to DISTAL RCA, using left internal mammary artery and right greater saphenous vein harvested endoscopically (N/A) TRANSESOPHAGEAL ECHOCARDIOGRAM (TEE) (N/A)  1. CV- PAF, maintaining NSR, + HTN- continue Amiodarone, Lopressor, will restart home Altace 2. Pulm- no acute issues, continue IS 3. Renal- creatinine WNL, weight is trending down, continue Lasix for the next week 4. DM- sugars controlled, continue current regimen 5. DIspo- patient stable, will d/c EPW, maintaining NSR.. Will discuss possible discharge home later today if okay with Dr. Roxy Manns   LOS: 4 days    Ellwood Handler 06/27/2017  I have seen and examined the patient and agree with the assessment and plan as outlined.  D/C home later today  Rexene Alberts, MD 06/27/2017 10:46 AM

## 2017-06-27 NOTE — Progress Notes (Signed)
EPW d/c'd per order and per protocol. Tips intact. VSS. Pt and family instructed on need for bedrest x1h and vitals q28m. Call bell and phone within reach. Will continue to monitor.

## 2017-06-28 MED FILL — Magnesium Sulfate Inj 50%: INTRAMUSCULAR | Qty: 10 | Status: AC

## 2017-06-28 MED FILL — Potassium Chloride Inj 2 mEq/ML: INTRAVENOUS | Qty: 40 | Status: AC

## 2017-06-28 MED FILL — Heparin Sodium (Porcine) Inj 1000 Unit/ML: INTRAMUSCULAR | Qty: 30 | Status: AC

## 2017-06-28 NOTE — Op Note (Signed)
NAME:  Phillip Le, Phillip Le NO.:  [id number]  MEDICAL RECORD NO.:  [id number]  LOCATION:                                 FACILITY:  PHYSICIAN:  Lanelle Bal, MD         DATE OF BIRTH:  DATE OF PROCEDURE:  06/23/2017 DATE OF DISCHARGE:                              OPERATIVE REPORT   PREOPERATIVE DIAGNOSIS:  unstable angina.  POSTOPERATIVE DIAGNOSIS: unstable angina.  PROCEDURE PERFORMED:  Coronary artery bypass grafting x4 with the left internal mammary to the left anterior descending coronary artery, reverse saphenous vein graft to the diagonal coronary artery, reverse saphenous vein graft to the circumflex coronary artery, reverse saphenous vein graft to the distal right coronary artery with right greater saphenous thigh and calf endo vein harvesting.  SURGEON:  Lanelle Bal, MD.  FIRST ASSISTANT:  Ellwood Handler, PA.  BRIEF HISTORY:  The patient is a 67 year old diabetic male who had new onset of anginal chest symptoms that were increasing in frequency and he was evaluated by Dr. Ubaldo Glassing.  Stress test was positive leading to cardiac catheterization, which demonstrated significant 3-vessel coronary artery disease with complex proximal LAD stenosis of 80% involving the diagonal and also high-grade proximal circumflex obstruction.  The right coronary artery was diffusely diseased with 50%-60%.  Overall, ventricular function was preserved.  The patient was referred for consideration of coronary artery bypass grafting.  Risks and options were discussed with him in detail and he was agreeable with proceeding.  DESCRIPTION OF PROCEDURE:  With Swan-Ganz and arterial line monitors in place, the patient underwent general endotracheal anesthesia without incident.  Skin of the chest and legs was prepped with Betadine and draped in usual sterile manner.  Appropriate time-out was performed.  We then proceeded with right thigh and calf endo vein harvesting.  The vein was  of good quality and caliber.  Median sternotomy was performed.  The left internal mammary artery was dissected down as a pedicle graft.  The distal artery was divided, had good free flow.  The pericardium was opened.  Overall ventricular function appeared preserved.  The patient was systemically heparinized.  Ascending aorta was cannulated.  Right atrium was cannulated and aortic root vent cardioplegia needle was introduced into the ascending aorta.  The patient was placed on cardiopulmonary bypass 2.4 L/min/m2.  Sites of anastomosis were dissected out of the epicardium and identified.  The patient's body temperature was cooled to 32 degrees.  Aortic crossclamp was applied and 500 mL of cold blood potassium cardioplegia was administered with diastolic arrest of the heart.  Myocardial septal temperature was monitored throughout the cross-clamp.  Attention was turned first to the distal right coronary artery, which was opened and admitted a 1.5 mm probe distally.  Using a running 7-0 Prolene, distal anastomosis was performed.  The heart was then elevated and the circumflex coronary artery was then identified partially intramyocardial.  The vessel was opened, admitted a 1.5 mm probe easily.  Using a running 7-0 Prolene, distal anastomosis was performed.  The diagonal coronary artery was relatively long vessel and it was opened, admitted a 1 mm probe.  Using a running 7-0 Prolene, distal anastomosis was performed  with a segment of reverse saphenous vein graft.  Attention was then turned to the left anterior descending coronary artery, which in between the mid and distal third of the vessel was opened, admitted a 1.5 mm probe proximally and distally.  Using a running 8-0 Prolene, left internal mammary artery was anastomosed to the left anterior descending coronary artery.  With the crossclamp still in place, 3 punch aortotomies were performed and each of the 3 vein grafts anastomosed to the  ascending aorta.  The bulldog was removed from the mammary artery with prompt rise in myocardial septal temperature.  The heart was allowed to passively fill and de-air. The proximal anastomoses were completed and crossclamp removed with total cross-clamp time of 90 minutes.  The patient spontaneously converted to a sinus rhythm.  He was atrially paced to increase rate. Sites of anastomosis were inspected and free of bleeding with the body temperature rewarmed to 37 degrees.  The patient was then ventilated and weaned from cardiopulmonary bypass without difficulty.  He was decannulated in usual fashion.  Protamine sulfate was administered with operative field hemostatic.  Atrial and ventricular pacing wires had been applied.  Left pleural tube and a Blake mediastinal drain were left in place.  The pericardium was loosely reapproximated.  The sternum was closed with #6 stainless steel wire.  Fascia was closed with interrupted 0 Vicryl, running 3-0 Vicryl in subcutaneous tissue, 4-0 subcuticular stitch in skin edges. A Provena incision wound VAC was placed on the patient's incision at the completion of the procedure. Sponge and needle count was reported as correct at completion of procedure.  The patient did not require any blood bank blood products during the operative procedure.  Total pump time was 119 minutes.       Lanelle Bal, MD   ______________________________ Lanelle Bal, MD    EG/MEDQ  D:  06/28/2017  T:  06/28/2017  Job:  [id number]  cc:   Bartholome Bill, MD

## 2017-06-29 MED FILL — Heparin Sodium (Porcine) Inj 1000 Unit/ML: INTRAMUSCULAR | Qty: 10 | Status: AC

## 2017-06-29 MED FILL — Lidocaine HCl IV Inj 20 MG/ML: INTRAVENOUS | Qty: 5 | Status: AC

## 2017-06-29 MED FILL — Sodium Chloride IV Soln 0.9%: INTRAVENOUS | Qty: 2000 | Status: AC

## 2017-06-29 MED FILL — Electrolyte-R (PH 7.4) Solution: INTRAVENOUS | Qty: 5000 | Status: AC

## 2017-06-29 MED FILL — Mannitol IV Soln 20%: INTRAVENOUS | Qty: 500 | Status: AC

## 2017-06-29 MED FILL — Sodium Bicarbonate IV Soln 8.4%: INTRAVENOUS | Qty: 50 | Status: AC

## 2017-07-02 ENCOUNTER — Telehealth: Payer: Self-pay

## 2017-07-02 ENCOUNTER — Encounter (HOSPITAL_COMMUNITY): Payer: Self-pay | Admitting: Cardiothoracic Surgery

## 2017-07-02 NOTE — Addendum Note (Signed)
Addendum  created 07/02/17 1538 by Belinda Block, MD   Intraprocedure Event edited, Intraprocedure Staff edited

## 2017-07-02 NOTE — Telephone Encounter (Signed)
Mrs. Riolo called to stated that she is concerned about Mr. Valdes' chest tube site.  He is s/p CABG on 06/23/2017.  She stated that there were steri-strips in place and caused a reaction to the skin.  His skin blistered up and so they removed the strips.  Once removed they noticed the chest tube site opened and were concerned that it was infected.  She stated that it was not oozing, he did not have a temperature, slight redness without swelling.  I advised the patient and wife to clean the site with peroxide and dress it with a dry dressing.  She stated that she placed antibiotic ointment on the incision site and I advised her to not place any more ointments to the incision site.  She acknowledged receipt.  Also, advised her to give [place] a call on Monday morning to let us know how the site looked and if he started to show signs of infection to let us know and we would get him in for an appointment.  Patient acknowledged receipt.

## 2017-08-09 ENCOUNTER — Other Ambulatory Visit: Payer: Self-pay | Admitting: Cardiothoracic Surgery

## 2017-08-09 ENCOUNTER — Encounter: Payer: Self-pay | Admitting: Surgical

## 2017-08-09 ENCOUNTER — Ambulatory Visit
Admission: RE | Admit: 2017-08-09 | Discharge: 2017-08-09 | Disposition: A | Discharge status: Home / Self Care | Payer: BLUE CROSS/BLUE SHIELD | Source: Ambulatory Visit | Attending: Cardiothoracic Surgery | Admitting: Cardiothoracic Surgery

## 2017-08-09 ENCOUNTER — Ambulatory Visit (INDEPENDENT_AMBULATORY_CARE_PROVIDER_SITE_OTHER): Payer: Self-pay | Admitting: Surgical

## 2017-08-09 VITALS — BP 118/78 | HR 64 | Resp 20 | Ht 69.0 in | Wt 203.0 lb

## 2017-08-09 DIAGNOSIS — Z951 Presence of aortocoronary bypass graft: Secondary | ICD-10-CM

## 2017-08-09 DIAGNOSIS — I25119 Atherosclerotic heart disease of native coronary artery with unspecified angina pectoris: Secondary | ICD-10-CM

## 2017-08-09 NOTE — Patient Instructions (Signed)
Reviewed activity progression and lifting restrictions.

## 2017-08-09 NOTE — Progress Notes (Signed)
[address]       [place] [address]             [phone]      [facility] Medical Record #[id number] Date of Birth: 1951/03/25  Referring: Teodoro Spray, MD Primary Care: Sofie Hartigan, MD Primary Cardiologist: Teodoro Spray, MD   Chief Complaint:   POST OP FOLLOW UP DATE OF PROCEDURE:  06/23/2017 DATE OF DISCHARGE:                              OPERATIVE REPORT   PREOPERATIVE DIAGNOSIS:  unstable angina.  POSTOPERATIVE DIAGNOSIS: unstable angina.  PROCEDURE PERFORMED:  Coronary artery bypass grafting x4 with the left internal mammary to the left anterior descending coronary artery, reverse saphenous vein graft to the diagonal coronary artery, reverse saphenous vein graft to the circumflex coronary artery, reverse saphenous vein graft to the distal right coronary artery with right greater saphenous thigh and calf endo vein harvesting.  SURGEON:  Lanelle Bal, MD.  FIRST ASSISTANT:  Ellwood Handler, PA.   History of Present Illness:    The patient is a 67 year old male status post the above described procedure seen in the office on today's date and routine postsurgical follow-up.  He states that he is doing quite well.  He has a mild productive cough.  He denies any fevers, chills or other significant constitutional symptoms.  He denies shortness of breath.  He is not having chest pain or anginal equivalents.  He denies palpitations.  He is not having any lower extremity edema.  Ambulation is steadily improving.  He is already started driving.  He is not using any pain medication at this time.  Overall he is quite pleased with his progress.  Currently he is not interested in pursuing cardiac rehabilitation program.      Past Medical History:  Diagnosis Date  . Allergy   . Arthritis   . Coronary artery disease   . Diabetes mellitus without complication ([place])   . Dysrhythmia   . Elevated lipids   . Gout   .  History of kidney stones   . Hypertension   . Sleep apnea   . Wears glasses      Social History   Tobacco Use  Smoking Status Former Smoker  . Last attempt to quit: 06/19/1978  . Years since quitting: 39.1  Smokeless Tobacco Never Used    Social History   Substance and Sexual Activity  Alcohol Use No     No Known Allergies  Current Outpatient Medications  Medication Sig Dispense Refill  . acetaminophen (TYLENOL) 500 MG tablet Take 2 tablets (1,000 mg total) by mouth every 6 (six) hours as needed for mild pain or fever. 30 tablet 0  . albuterol (PROVENTIL HFA;VENTOLIN HFA) 108 (90 Base) MCG/ACT inhaler Inhale 1-2 puffs into the lungs every 6 (six) hours as needed for wheezing. 1 Inhaler 0  . amiodarone (PACERONE) 200 MG tablet Take 1 tablet (200 mg total) by mouth 2 (two) times daily after a meal. For 7 Days, then decrease to 200 mg daiy 60 tablet 1  . aspirin EC 325 MG EC tablet Take 1 tablet (325 mg total) by mouth daily. 30 tablet 0  . ketoconazole (NIZORAL) 2 % cream Apply 1 application topically daily as needed for irritation.     . metFORMIN (GLUCOPHAGE) 500 MG tablet Take 500 mg  by mouth 2 (two) times daily with a meal.     . metoprolol tartrate (LOPRESSOR) 25 MG tablet Take 25 mg by mouth 2 (two) times daily.    . pravastatin (PRAVACHOL) 10 MG tablet Take 10 mg by mouth daily.    . ramipril (ALTACE) 5 MG capsule Take 1 capsule (5 mg total) by mouth daily. 30 capsule 3  . benzonatate (TESSALON) 200 MG capsule Take 1 capsule (200 mg total) by mouth 3 (three) times daily as needed. (Patient not taking: Reported on 08/09/2017) 30 capsule 0   No current facility-administered medications for this visit.        Physical Exam: BP 118/78   Pulse 64   Resp 20   Ht 5\' 9"  (1.753 m)   Wt 92.1 kg (203 lb)   SpO2 97% Comment: RA  BMI 29.98 kg/m   General appearance: alert, cooperative and no distress Heart: regular rate and rhythm Lungs: clear to auscultation  bilaterally Abdomen: benign Extremities: nop edema Wound: Incisions healing well without evidence of infection.  The right lower extremity EVH site did have some suture protruding from both ends of the incision which I removed carefully.   Diagnostic Studies & Laboratory data:     Recent Radiology Findings:   Dg Chest 2 View  Result Date: 08/09/2017 CLINICAL DATA:  Recent CABG.  No complaints. EXAM: CHEST - 2 VIEW COMPARISON:  June 26, 2017 FINDINGS: Sternotomy wires are intact. The heart, hila, and mediastinum are normal. No pneumothorax. No nodules or masses. Mild bibasilar haziness likely represents atelectasis given the lack of current symptoms. No other acute abnormalities. IMPRESSION: Mild haziness in the lung bases is probably atelectasis/scar given the lack of acute symptoms. No other abnormalities. Electronically Signed   By: Dorise Bullion III M.D   On: 08/09/2017 12:58      Recent Lab Findings: Lab Results  Component Value Date   WBC 8.8 06/27/2017   HGB 14.1 06/27/2017   HCT 40.5 06/27/2017   PLT 145 (L) 06/27/2017   GLUCOSE 152 (H) 06/27/2017   ALT 41 06/22/2017   AST 23 06/22/2017   NA 138 06/27/2017   K 3.9 06/27/2017   CL 104 06/27/2017   CREATININE 0.71 06/27/2017   BUN 13 06/27/2017   CO2 25 06/27/2017   TSH 2.643 06/25/2017   INR 1.48 06/23/2017   HGBA1C 6.6 (H) 06/22/2017      Assessment / Plan: Patient is doing very well.  He has now placed himself on a low carbohydrate diet and is losing some weight.  His blood sugars have been under excellent control with readings between the 90s and low 120s.  He continues to be on metformin.  He is not having any surgically related issues at this time.  We will see him again in the office on a as needed basis or as requested.  He will continue to follow-up with his cardiologist in [place].        John Giovanni, PA-C 08/09/2017 1:20 PM

## 2019-01-24 IMAGING — CR DG CHEST 2V
1 series · 2 of 2 positions shown · non-contrast
Comparison: 07/01/2016

CLINICAL DATA: Chest pressure

EXAM:
CHEST - 2 VIEW

[Series 1: dg chest 2 view · 0.14mm/px · 2 of 2 slices shown]
[im 1/2]
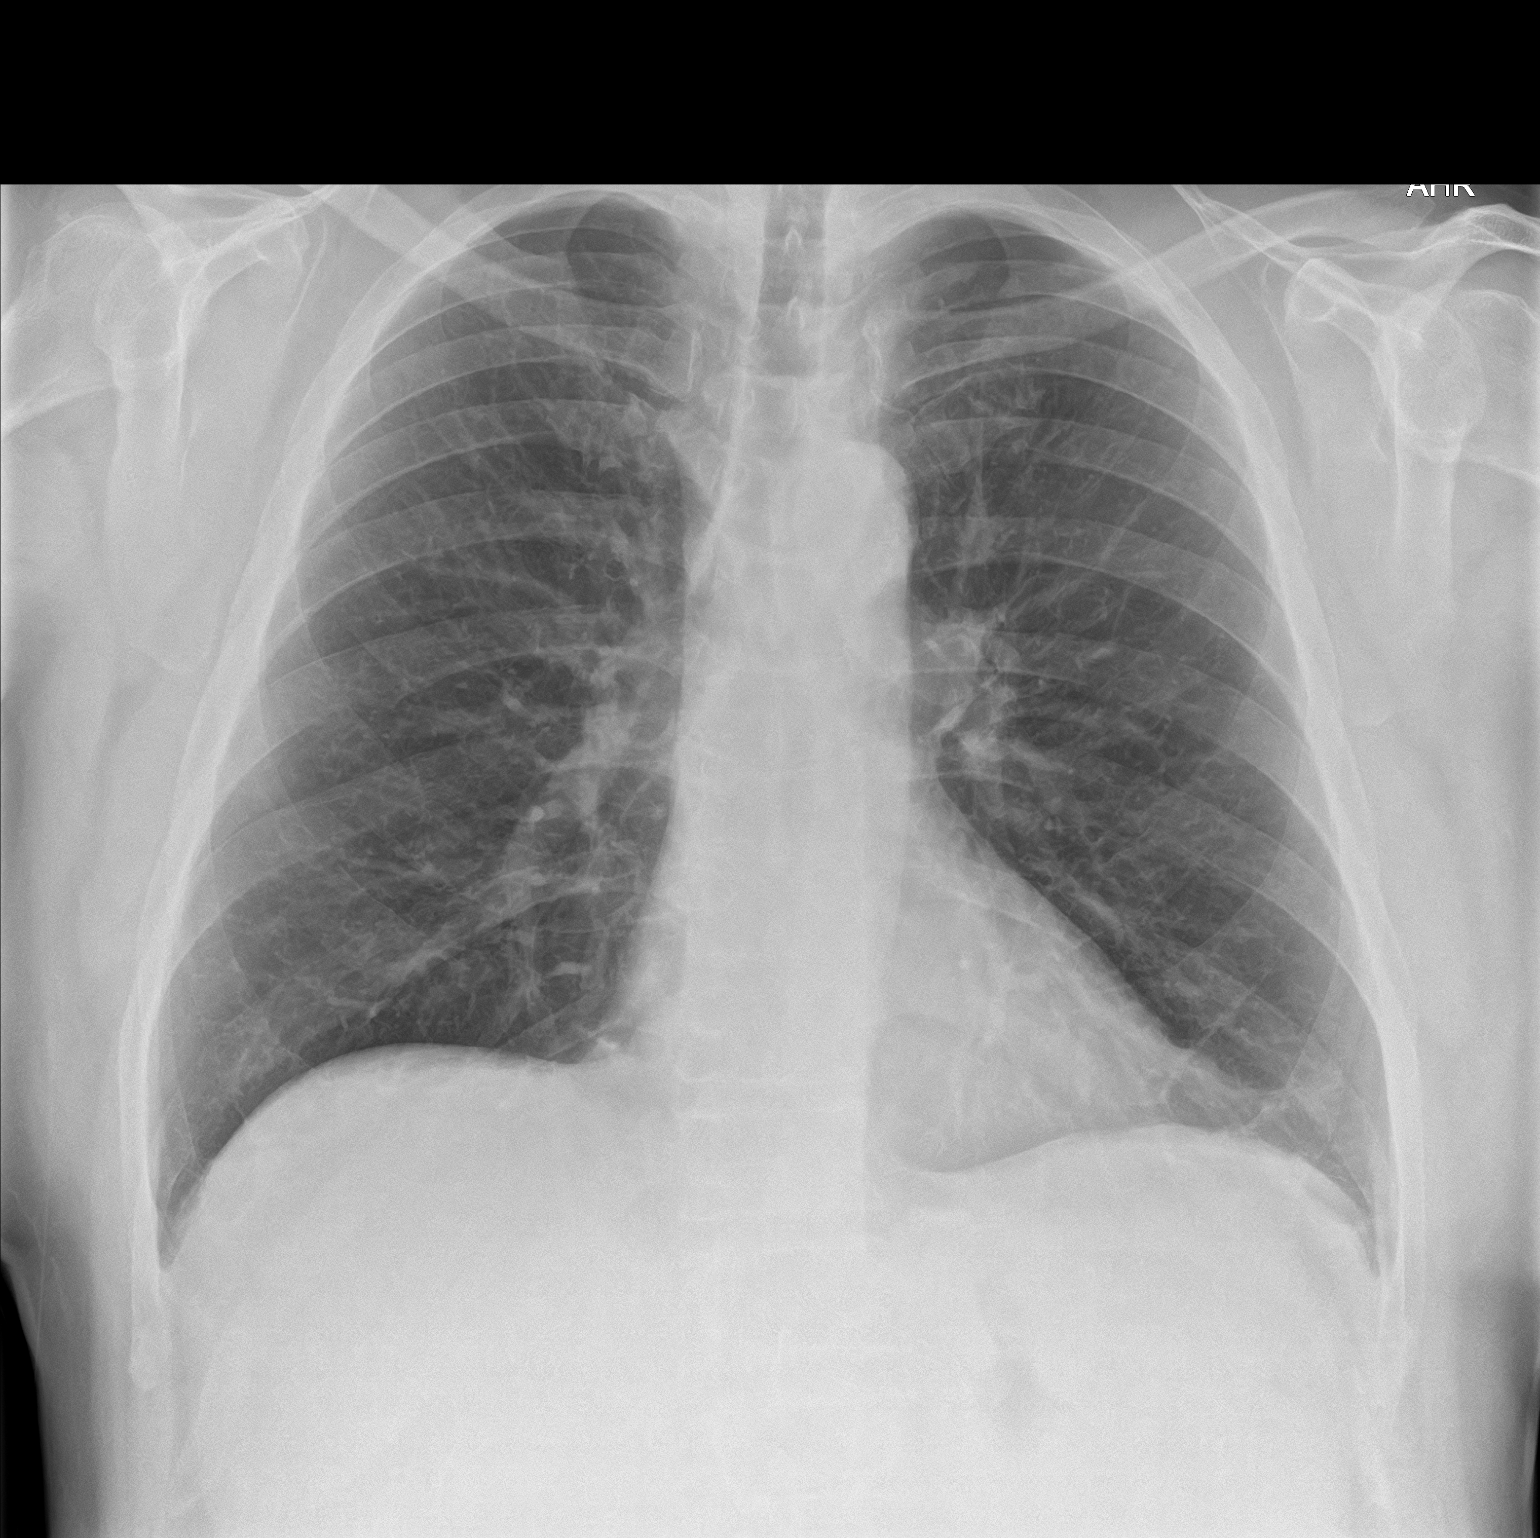
[im 2/2]
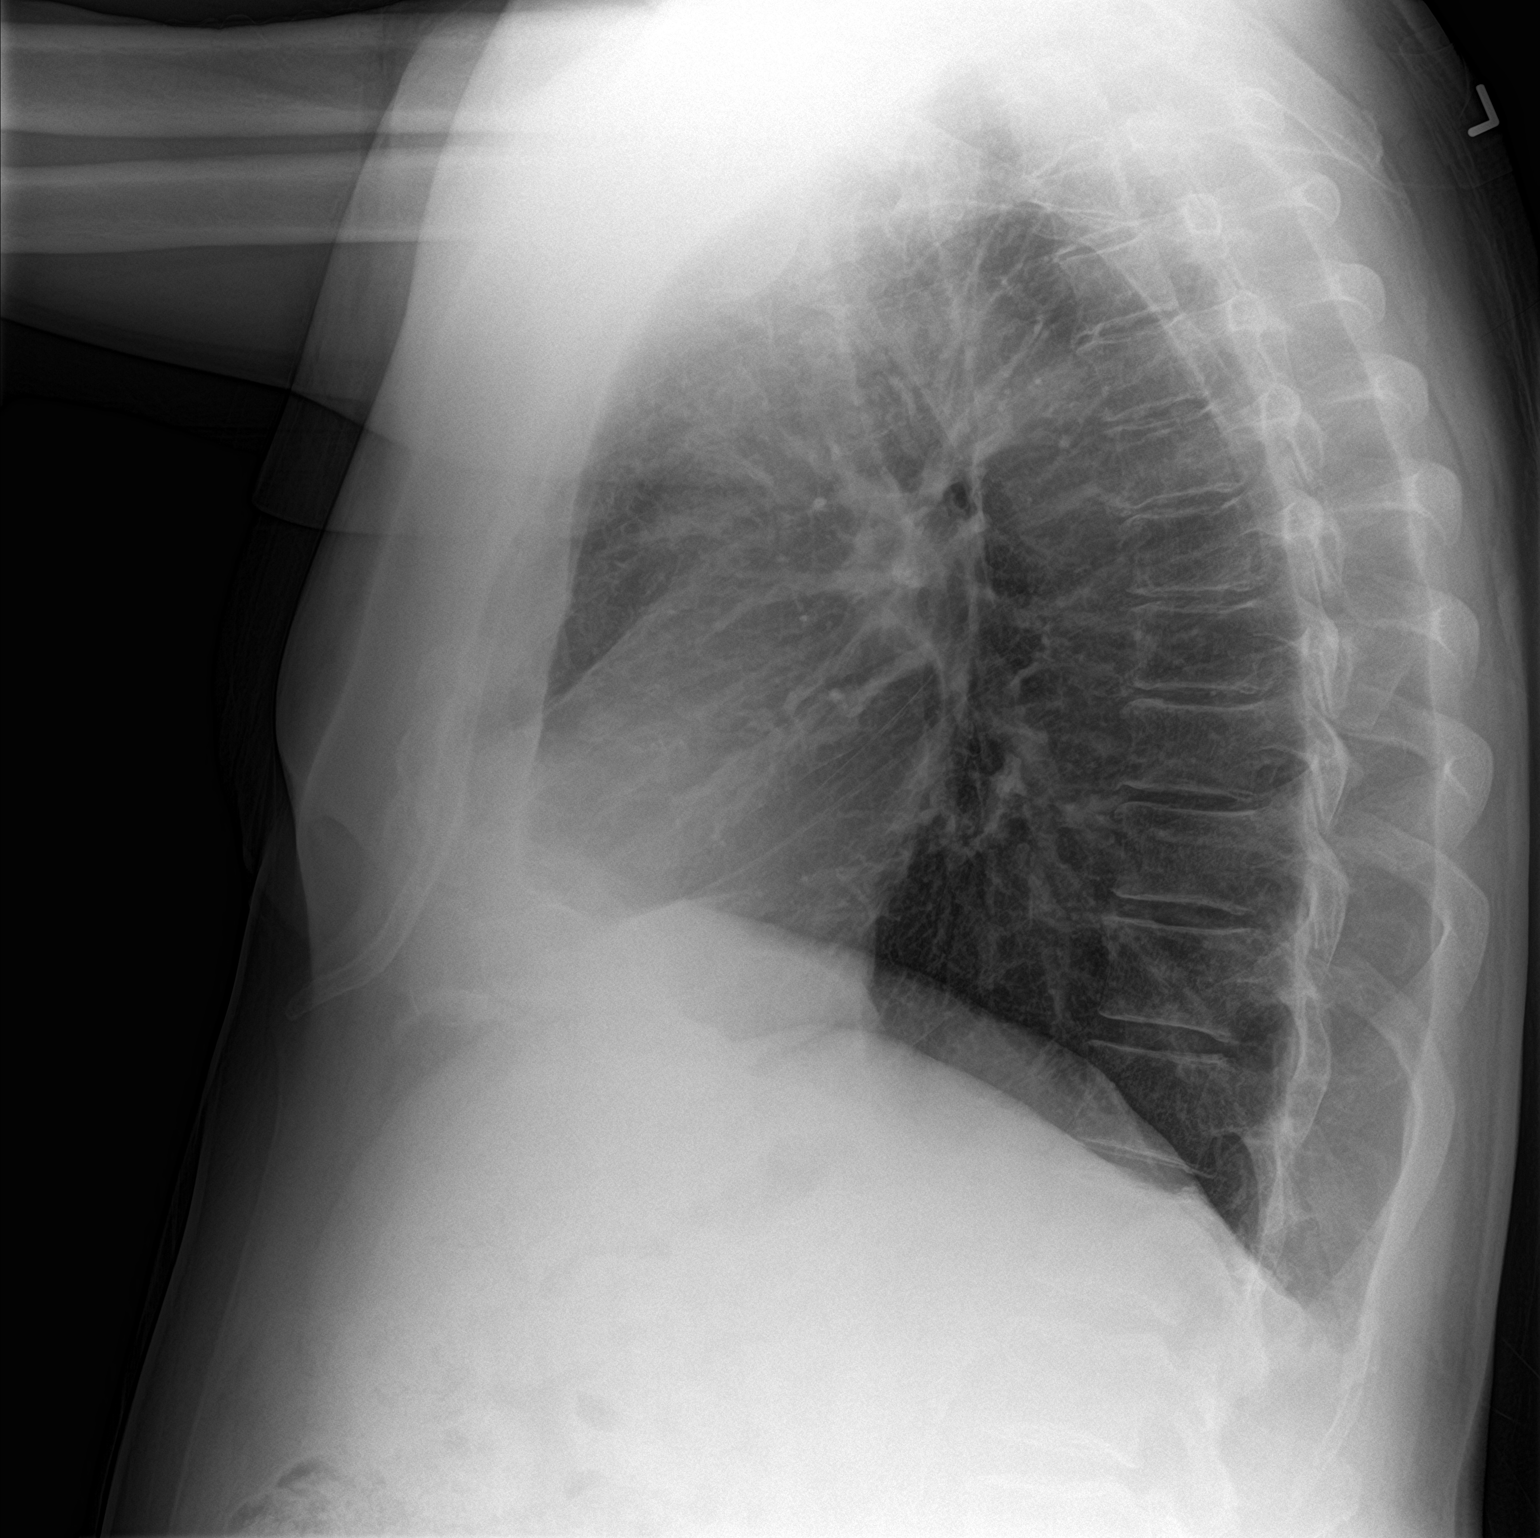

[2 of 2 positions shown; findings below may reference images not displayed]

FINDINGS: The heart size and mediastinal contours are within normal limits.
Both lungs are clear. The visualized skeletal structures are
unremarkable.
IMPRESSION: No active cardiopulmonary disease.

## 2019-02-06 IMAGING — DX DG CHEST 1V PORT
1 series · 1 of 1 positions shown · non-contrast
Comparison: 06/23/2017

CLINICAL DATA: CABG

EXAM:
PORTABLE CHEST 1 VIEW

[chest]
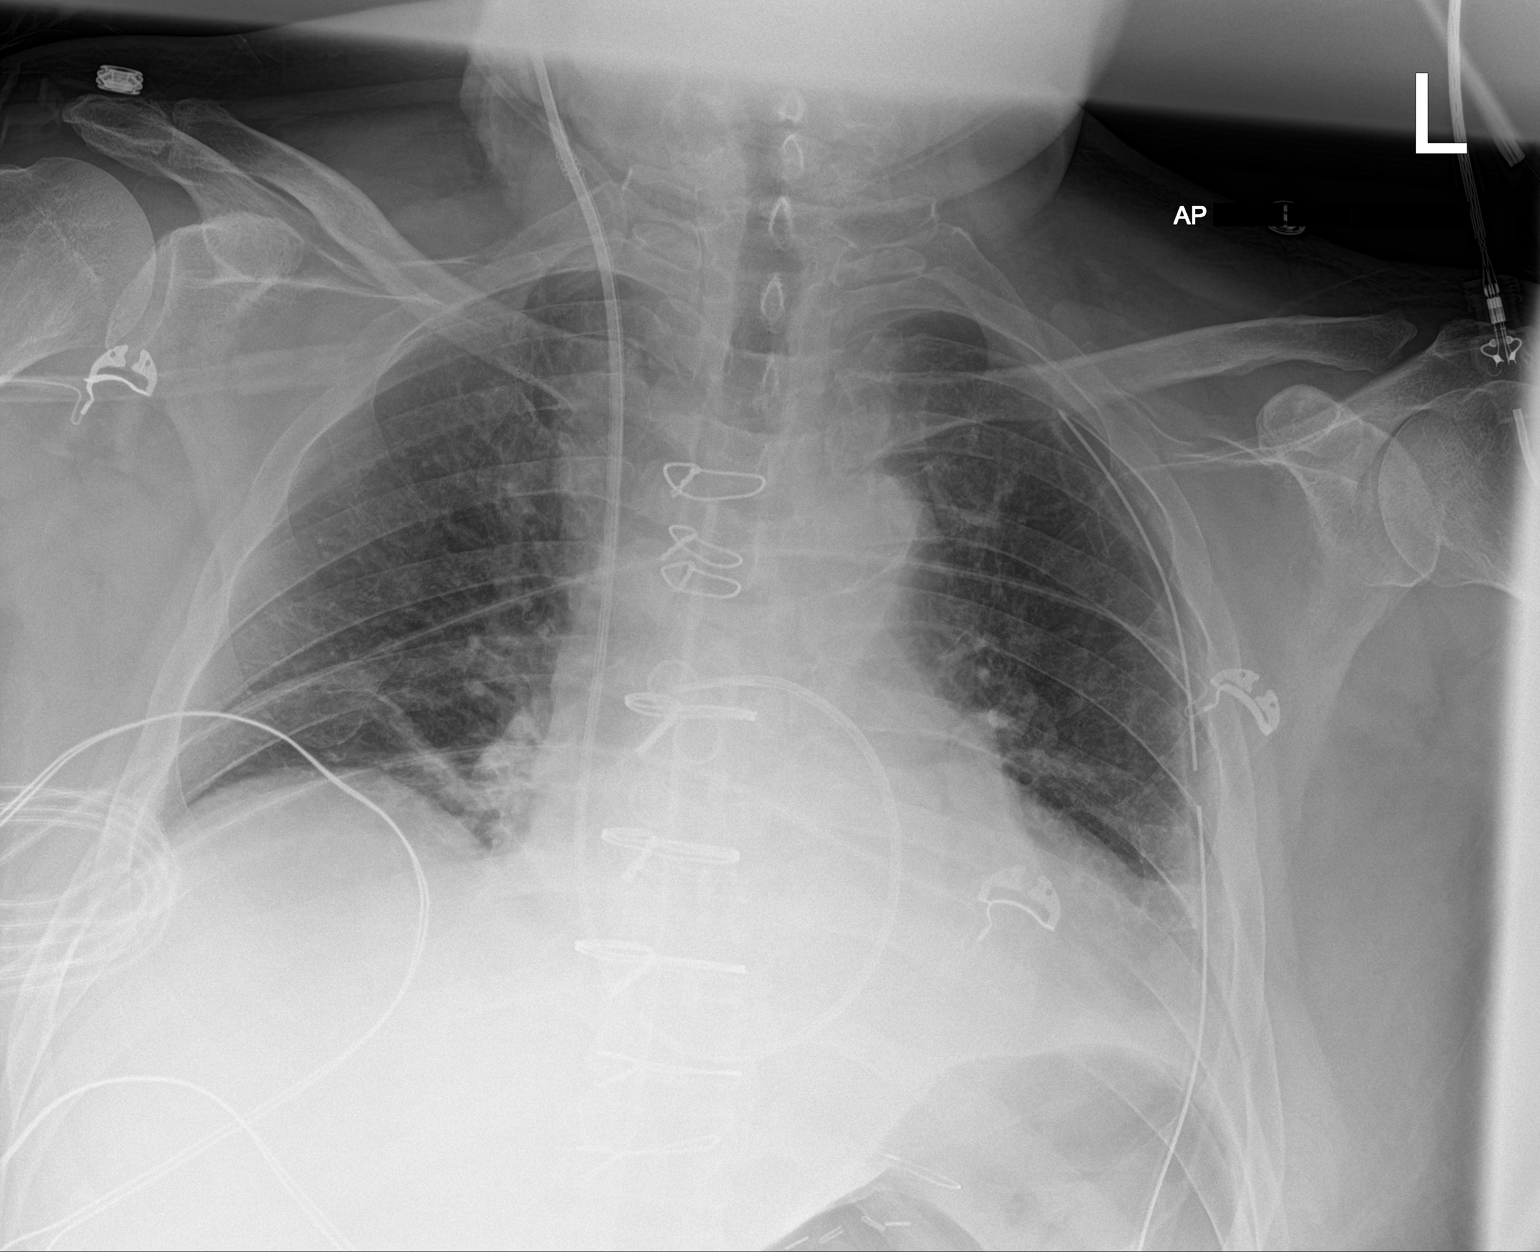

[1 of 1 positions shown; findings below may reference images not displayed]

FINDINGS: Endotracheal tube and NG removed. Swan-Ganz catheter tip remains in
the right pulmonary artery.

Left chest tube in place.  No pneumothorax.

Mild bibasilar atelectasis and small left effusion unchanged.
Negative for edema
IMPRESSION: Endotracheal tube removed.  Negative for pneumothorax

Bibasilar atelectasis and small left effusion unchanged.

## 2019-06-27 DIAGNOSIS — E7800 Pure hypercholesterolemia, unspecified: Secondary | ICD-10-CM | POA: Diagnosis not present

## 2019-06-27 DIAGNOSIS — E119 Type 2 diabetes mellitus without complications: Secondary | ICD-10-CM | POA: Diagnosis not present

## 2019-06-27 DIAGNOSIS — I10 Essential (primary) hypertension: Secondary | ICD-10-CM | POA: Diagnosis not present

## 2019-07-04 DIAGNOSIS — N401 Enlarged prostate with lower urinary tract symptoms: Secondary | ICD-10-CM | POA: Diagnosis not present

## 2019-07-04 DIAGNOSIS — E1165 Type 2 diabetes mellitus with hyperglycemia: Secondary | ICD-10-CM | POA: Diagnosis not present

## 2019-07-04 DIAGNOSIS — Z0000 Encounter for general adult medical examination without abnormal findings: Secondary | ICD-10-CM | POA: Diagnosis not present

## 2019-07-04 DIAGNOSIS — I10 Essential (primary) hypertension: Secondary | ICD-10-CM | POA: Diagnosis not present

## 2019-07-04 DIAGNOSIS — E7800 Pure hypercholesterolemia, unspecified: Secondary | ICD-10-CM | POA: Diagnosis not present

## 2019-09-11 DIAGNOSIS — I2510 Atherosclerotic heart disease of native coronary artery without angina pectoris: Secondary | ICD-10-CM | POA: Diagnosis not present

## 2019-09-11 DIAGNOSIS — Z794 Long term (current) use of insulin: Secondary | ICD-10-CM | POA: Diagnosis not present

## 2019-09-11 DIAGNOSIS — E119 Type 2 diabetes mellitus without complications: Secondary | ICD-10-CM | POA: Diagnosis not present

## 2019-09-11 DIAGNOSIS — G4734 Idiopathic sleep related nonobstructive alveolar hypoventilation: Secondary | ICD-10-CM | POA: Diagnosis not present

## 2019-09-11 DIAGNOSIS — E7800 Pure hypercholesterolemia, unspecified: Secondary | ICD-10-CM | POA: Diagnosis not present

## 2019-09-11 DIAGNOSIS — I10 Essential (primary) hypertension: Secondary | ICD-10-CM | POA: Diagnosis not present

## 2019-10-02 DIAGNOSIS — E7800 Pure hypercholesterolemia, unspecified: Secondary | ICD-10-CM | POA: Diagnosis not present

## 2019-10-02 DIAGNOSIS — E1165 Type 2 diabetes mellitus with hyperglycemia: Secondary | ICD-10-CM | POA: Diagnosis not present

## 2019-10-13 DIAGNOSIS — N401 Enlarged prostate with lower urinary tract symptoms: Secondary | ICD-10-CM | POA: Diagnosis not present

## 2019-10-13 DIAGNOSIS — I10 Essential (primary) hypertension: Secondary | ICD-10-CM | POA: Diagnosis not present

## 2019-10-13 DIAGNOSIS — E7800 Pure hypercholesterolemia, unspecified: Secondary | ICD-10-CM | POA: Diagnosis not present

## 2019-10-13 DIAGNOSIS — E1165 Type 2 diabetes mellitus with hyperglycemia: Secondary | ICD-10-CM | POA: Diagnosis not present

## 2019-10-16 DIAGNOSIS — D2271 Melanocytic nevi of right lower limb, including hip: Secondary | ICD-10-CM | POA: Diagnosis not present

## 2019-10-16 DIAGNOSIS — L570 Actinic keratosis: Secondary | ICD-10-CM | POA: Diagnosis not present

## 2019-10-16 DIAGNOSIS — D2272 Melanocytic nevi of left lower limb, including hip: Secondary | ICD-10-CM | POA: Diagnosis not present

## 2019-10-16 DIAGNOSIS — D2261 Melanocytic nevi of right upper limb, including shoulder: Secondary | ICD-10-CM | POA: Diagnosis not present

## 2019-10-16 DIAGNOSIS — L821 Other seborrheic keratosis: Secondary | ICD-10-CM | POA: Diagnosis not present

## 2019-10-16 DIAGNOSIS — R202 Paresthesia of skin: Secondary | ICD-10-CM | POA: Diagnosis not present

## 2019-10-16 DIAGNOSIS — D225 Melanocytic nevi of trunk: Secondary | ICD-10-CM | POA: Diagnosis not present

## 2019-10-16 DIAGNOSIS — L218 Other seborrheic dermatitis: Secondary | ICD-10-CM | POA: Diagnosis not present

## 2019-10-16 DIAGNOSIS — D2262 Melanocytic nevi of left upper limb, including shoulder: Secondary | ICD-10-CM | POA: Diagnosis not present

## 2019-10-16 DIAGNOSIS — X32XXXA Exposure to sunlight, initial encounter: Secondary | ICD-10-CM | POA: Diagnosis not present

## 2019-12-22 DIAGNOSIS — N5201 Erectile dysfunction due to arterial insufficiency: Secondary | ICD-10-CM | POA: Diagnosis not present

## 2019-12-22 DIAGNOSIS — Z125 Encounter for screening for malignant neoplasm of prostate: Secondary | ICD-10-CM | POA: Diagnosis not present

## 2019-12-22 DIAGNOSIS — R3915 Urgency of urination: Secondary | ICD-10-CM | POA: Diagnosis not present

## 2020-01-19 DIAGNOSIS — E119 Type 2 diabetes mellitus without complications: Secondary | ICD-10-CM | POA: Diagnosis not present

## 2020-01-26 DIAGNOSIS — R3915 Urgency of urination: Secondary | ICD-10-CM | POA: Diagnosis not present

## 2020-01-26 DIAGNOSIS — N5201 Erectile dysfunction due to arterial insufficiency: Secondary | ICD-10-CM | POA: Diagnosis not present

## 2020-01-26 DIAGNOSIS — N403 Nodular prostate with lower urinary tract symptoms: Secondary | ICD-10-CM | POA: Diagnosis not present

## 2020-02-05 DIAGNOSIS — E1165 Type 2 diabetes mellitus with hyperglycemia: Secondary | ICD-10-CM | POA: Diagnosis not present

## 2020-02-05 DIAGNOSIS — E7800 Pure hypercholesterolemia, unspecified: Secondary | ICD-10-CM | POA: Diagnosis not present

## 2020-02-12 DIAGNOSIS — I10 Essential (primary) hypertension: Secondary | ICD-10-CM | POA: Diagnosis not present

## 2020-02-12 DIAGNOSIS — N401 Enlarged prostate with lower urinary tract symptoms: Secondary | ICD-10-CM | POA: Diagnosis not present

## 2020-02-12 DIAGNOSIS — E1165 Type 2 diabetes mellitus with hyperglycemia: Secondary | ICD-10-CM | POA: Diagnosis not present

## 2020-02-12 DIAGNOSIS — E7800 Pure hypercholesterolemia, unspecified: Secondary | ICD-10-CM | POA: Diagnosis not present

## 2020-03-06 DIAGNOSIS — E7800 Pure hypercholesterolemia, unspecified: Secondary | ICD-10-CM | POA: Diagnosis not present

## 2020-03-06 DIAGNOSIS — I2510 Atherosclerotic heart disease of native coronary artery without angina pectoris: Secondary | ICD-10-CM | POA: Diagnosis not present

## 2020-03-06 DIAGNOSIS — G4734 Idiopathic sleep related nonobstructive alveolar hypoventilation: Secondary | ICD-10-CM | POA: Diagnosis not present

## 2020-03-06 DIAGNOSIS — Z794 Long term (current) use of insulin: Secondary | ICD-10-CM | POA: Diagnosis not present

## 2020-03-06 DIAGNOSIS — I10 Essential (primary) hypertension: Secondary | ICD-10-CM | POA: Diagnosis not present

## 2020-03-06 DIAGNOSIS — R002 Palpitations: Secondary | ICD-10-CM | POA: Diagnosis not present

## 2020-03-06 DIAGNOSIS — E119 Type 2 diabetes mellitus without complications: Secondary | ICD-10-CM | POA: Diagnosis not present

## 2020-04-15 DIAGNOSIS — Z794 Long term (current) use of insulin: Secondary | ICD-10-CM | POA: Diagnosis not present

## 2020-04-15 DIAGNOSIS — E119 Type 2 diabetes mellitus without complications: Secondary | ICD-10-CM | POA: Diagnosis not present

## 2020-04-15 DIAGNOSIS — I2510 Atherosclerotic heart disease of native coronary artery without angina pectoris: Secondary | ICD-10-CM | POA: Diagnosis not present

## 2020-04-15 DIAGNOSIS — E7800 Pure hypercholesterolemia, unspecified: Secondary | ICD-10-CM | POA: Diagnosis not present

## 2020-04-15 DIAGNOSIS — I10 Essential (primary) hypertension: Secondary | ICD-10-CM | POA: Diagnosis not present

## 2020-04-15 DIAGNOSIS — G4734 Idiopathic sleep related nonobstructive alveolar hypoventilation: Secondary | ICD-10-CM | POA: Diagnosis not present

## 2020-04-15 DIAGNOSIS — R002 Palpitations: Secondary | ICD-10-CM | POA: Diagnosis not present

## 2020-06-21 DIAGNOSIS — N401 Enlarged prostate with lower urinary tract symptoms: Secondary | ICD-10-CM | POA: Diagnosis not present

## 2020-06-21 DIAGNOSIS — Z0000 Encounter for general adult medical examination without abnormal findings: Secondary | ICD-10-CM | POA: Diagnosis not present

## 2020-06-21 DIAGNOSIS — I10 Essential (primary) hypertension: Secondary | ICD-10-CM | POA: Diagnosis not present

## 2020-06-21 DIAGNOSIS — E1165 Type 2 diabetes mellitus with hyperglycemia: Secondary | ICD-10-CM | POA: Diagnosis not present

## 2020-06-21 DIAGNOSIS — E7800 Pure hypercholesterolemia, unspecified: Secondary | ICD-10-CM | POA: Diagnosis not present

## 2020-08-21 DIAGNOSIS — J22 Unspecified acute lower respiratory infection: Secondary | ICD-10-CM | POA: Diagnosis not present

## 2020-08-21 DIAGNOSIS — E109 Type 1 diabetes mellitus without complications: Secondary | ICD-10-CM | POA: Diagnosis not present

## 2020-08-21 DIAGNOSIS — R059 Cough, unspecified: Secondary | ICD-10-CM | POA: Diagnosis not present

## 2020-08-21 DIAGNOSIS — I10 Essential (primary) hypertension: Secondary | ICD-10-CM | POA: Diagnosis not present

## 2020-09-30 DIAGNOSIS — I10 Essential (primary) hypertension: Secondary | ICD-10-CM | POA: Diagnosis not present

## 2020-09-30 DIAGNOSIS — E1165 Type 2 diabetes mellitus with hyperglycemia: Secondary | ICD-10-CM | POA: Diagnosis not present

## 2020-09-30 DIAGNOSIS — N401 Enlarged prostate with lower urinary tract symptoms: Secondary | ICD-10-CM | POA: Diagnosis not present

## 2020-09-30 DIAGNOSIS — E7800 Pure hypercholesterolemia, unspecified: Secondary | ICD-10-CM | POA: Diagnosis not present

## 2020-10-01 DIAGNOSIS — E1165 Type 2 diabetes mellitus with hyperglycemia: Secondary | ICD-10-CM | POA: Diagnosis not present

## 2020-10-01 DIAGNOSIS — E7800 Pure hypercholesterolemia, unspecified: Secondary | ICD-10-CM | POA: Diagnosis not present

## 2020-10-15 DIAGNOSIS — D2262 Melanocytic nevi of left upper limb, including shoulder: Secondary | ICD-10-CM | POA: Diagnosis not present

## 2020-10-15 DIAGNOSIS — D225 Melanocytic nevi of trunk: Secondary | ICD-10-CM | POA: Diagnosis not present

## 2020-10-15 DIAGNOSIS — L570 Actinic keratosis: Secondary | ICD-10-CM | POA: Diagnosis not present

## 2020-10-15 DIAGNOSIS — D2272 Melanocytic nevi of left lower limb, including hip: Secondary | ICD-10-CM | POA: Diagnosis not present

## 2020-10-15 DIAGNOSIS — L821 Other seborrheic keratosis: Secondary | ICD-10-CM | POA: Diagnosis not present

## 2020-10-15 DIAGNOSIS — D2271 Melanocytic nevi of right lower limb, including hip: Secondary | ICD-10-CM | POA: Diagnosis not present

## 2020-10-15 DIAGNOSIS — D2261 Melanocytic nevi of right upper limb, including shoulder: Secondary | ICD-10-CM | POA: Diagnosis not present

## 2020-10-15 DIAGNOSIS — X32XXXA Exposure to sunlight, initial encounter: Secondary | ICD-10-CM | POA: Diagnosis not present

## 2020-11-05 DIAGNOSIS — R002 Palpitations: Secondary | ICD-10-CM | POA: Diagnosis not present

## 2020-11-05 DIAGNOSIS — Z794 Long term (current) use of insulin: Secondary | ICD-10-CM | POA: Diagnosis not present

## 2020-11-05 DIAGNOSIS — E119 Type 2 diabetes mellitus without complications: Secondary | ICD-10-CM | POA: Diagnosis not present

## 2020-11-05 DIAGNOSIS — G4734 Idiopathic sleep related nonobstructive alveolar hypoventilation: Secondary | ICD-10-CM | POA: Diagnosis not present

## 2020-11-05 DIAGNOSIS — E7800 Pure hypercholesterolemia, unspecified: Secondary | ICD-10-CM | POA: Diagnosis not present

## 2020-11-05 DIAGNOSIS — I2510 Atherosclerotic heart disease of native coronary artery without angina pectoris: Secondary | ICD-10-CM | POA: Diagnosis not present

## 2020-11-05 DIAGNOSIS — I10 Essential (primary) hypertension: Secondary | ICD-10-CM | POA: Diagnosis not present

## 2020-11-18 ENCOUNTER — Ambulatory Visit
Admission: EM | Admit: 2020-11-18 | Discharge: 2020-11-18 | Disposition: A | Discharge status: Home / Self Care | Payer: PPO | Attending: Physician Assistant | Admitting: Physician Assistant

## 2020-11-18 ENCOUNTER — Other Ambulatory Visit: Payer: Self-pay

## 2020-11-18 ENCOUNTER — Ambulatory Visit (INDEPENDENT_AMBULATORY_CARE_PROVIDER_SITE_OTHER): Payer: PPO

## 2020-11-18 DIAGNOSIS — N200 Calculus of kidney: Secondary | ICD-10-CM | POA: Diagnosis not present

## 2020-11-18 DIAGNOSIS — S22080A Wedge compression fracture of T11-T12 vertebra, initial encounter for closed fracture: Secondary | ICD-10-CM | POA: Diagnosis not present

## 2020-11-18 DIAGNOSIS — I878 Other specified disorders of veins: Secondary | ICD-10-CM | POA: Diagnosis not present

## 2020-11-18 DIAGNOSIS — R310 Gross hematuria: Secondary | ICD-10-CM | POA: Diagnosis not present

## 2020-11-18 DIAGNOSIS — R319 Hematuria, unspecified: Secondary | ICD-10-CM

## 2020-11-18 LAB — URINALYSIS, COMPLETE (UACMP) WITH MICROSCOPIC
Bacteria, UA: NONE SEEN
Bilirubin Urine: NEGATIVE
Glucose, UA: NEGATIVE mg/dL
Ketones, ur: NEGATIVE mg/dL
Leukocytes,Ua: NEGATIVE
Nitrite: NEGATIVE
Protein, ur: 100 mg/dL — AB
RBC / HPF: >50 RBC/hpf (ref 0–5)
Specific Gravity, Urine: 1.02 (ref 1.005–1.030)
pH: 7 (ref 5.0–8.0)

## 2020-11-18 MED ORDER — TAMSULOSIN HCL 0.4 MG PO CAPS: 0.4000 mg | ORAL_CAPSULE | Freq: Every day | ORAL | 0 refills | Status: AC

## 2020-11-18 NOTE — ED Provider Notes (Signed)
[facility]    CSN: [id number] Arrival date & time: 11/18/20  1035      History   Chief Complaint Chief Complaint  Patient presents with   Hematuria    HPI Phillip Le is a 70 y.o. male presenting after noticing gross blood in the urine this morning.  He states he urinated 3 times and he has had blood every time.  He denies any other symptoms.  States he has not had any fever, chills, sweats, body aches, fatigue, abdominal pain or back/flank pain.  No dysuria and no testicular pain or swelling.  Patient does have history of kidney stones with his last kidney stone being 5 to 6 years ago.  Patient does report that he has a urologist in [place] who he has been following up with regarding a mass on his prostate which is apparently benign but being watched closely.  Patient denies any other complaints.  HPI  Past Medical History:  Diagnosis Date   Allergy    Arthritis    Coronary artery disease    Diabetes mellitus without complication ([place])    Dysrhythmia    Elevated lipids    Gout    History of kidney stones    Hypertension    Sleep apnea    Wears glasses     Patient Active Problem List   Diagnosis Date Noted   S/P CABG x 4 06/23/2017   Intermittent palpitations 02/09/2014   Type II diabetes mellitus ([place]) 09/15/2013   Hypertension 09/15/2013   Other and unspecified hyperlipidemia 09/15/2013   Sleep apnea 09/15/2013    Past Surgical History:  Procedure Laterality Date   ADENOIDECTOMY     APPENDECTOMY     COLONOSCOPY WITH PROPOFOL N/A 11/30/2016   Procedure: COLONOSCOPY WITH PROPOFOL;  Surgeon: Manya Silvas, MD;  [facility] ENDOSCOPY;  Service: Endoscopy;  Laterality: N/A;   CORONARY ARTERY BYPASS GRAFT N/A 06/23/2017   Procedure: CORONARY ARTERY BYPASS GRAFTING (CABG) x 4, on pump,LIMA to LAD, SVG to OM, SVG to DIAGONAL, SVG to DISTAL RCA, using left internal mammary artery and right greater saphenous vein harvested endoscopically;   Surgeon: Grace Isaac, MD;  Location: [facility];  Service: Open Heart Surgery;  Laterality: N/A;   LEFT HEART CATH AND CORONARY ANGIOGRAPHY Left 06/18/2017   Procedure: LEFT HEART CATH AND CORONARY ANGIOGRAPHY;  Surgeon: Teodoro Spray, MD;  Location: [facility];  Service: Cardiovascular;  Laterality: Left;   SHOULDER SURGERY Right    approx 8-10 years ago   TEE WITHOUT CARDIOVERSION N/A 06/23/2017   Procedure: TRANSESOPHAGEAL ECHOCARDIOGRAM (TEE);  Surgeon: Grace Isaac, MD;  Location: [facility];  Service: Open Heart Surgery;  Laterality: N/A;   TONSILLECTOMY         Home Medications    Prior to Admission medications   Medication Sig Start Date End Date Taking? Authorizing Provider  metFORMIN (GLUCOPHAGE) 500 MG tablet Take by mouth. 10/13/19  Yes [provider]  Semaglutide,0.25 or 0.'5MG'$ /DOS, (OZEMPIC, 0.25 OR 0.5 MG/DOSE,) 2 MG/1.5ML SOPN Inject into the skin.   Yes [provider]  tamsulosin (FLOMAX) 0.4 MG CAPS capsule Take 1 capsule (0.4 mg total) by mouth daily. 11/18/20 12/18/20 Yes Danton Clap, PA-C  acetaminophen (TYLENOL) 500 MG tablet Take 2 tablets (1,000 mg total) by mouth every 6 (six) hours as needed for mild pain or fever. 06/27/17   Barrett, Erin R, PA-C  albuterol (PROVENTIL HFA;VENTOLIN HFA) 108 (90 Base) MCG/ACT inhaler Inhale 1-2  puffs into the lungs every 6 (six) hours as needed for wheezing. 05/24/17   Norval Gable, MD  amiodarone (PACERONE) 200 MG tablet Take 1 tablet (200 mg total) by mouth 2 (two) times daily after a meal. For 7 Days, then decrease to 200 mg daiy 06/27/17   Barrett, Erin R, PA-C  aspirin 81 MG EC tablet Take by mouth.    [provider]  aspirin EC 325 MG EC tablet Take 1 tablet (325 mg total) by mouth daily. 06/28/17   Barrett, Erin R, PA-C  benzonatate (TESSALON) 200 MG capsule Take 1 capsule (200 mg total) by mouth 3 (three) times daily as needed. Patient not taking: Reported on 08/09/2017 05/24/17   Norval Gable, MD  ketoconazole (NIZORAL) 2 % cream Apply 1 application topically daily as needed for irritation.  01/30/15   [provider]  metoprolol tartrate (LOPRESSOR) 25 MG tablet Take 25 mg by mouth 2 (two) times daily. 06/08/17   [provider]  pravastatin (PRAVACHOL) 10 MG tablet Take 10 mg by mouth daily.    [provider]  ramipril (ALTACE) 5 MG capsule Take 1 capsule (5 mg total) by mouth daily. 06/28/17   Barrett, Lodema Hong, PA-C    Family History Family History  Problem Relation Age of Onset   CAD Mother    Cancer Father     Social History Social History   Tobacco Use   Smoking status: Former    Types: Cigarettes    Quit date: 06/19/1978    Years since quitting: 42.4   Smokeless tobacco: Never  Vaping Use   Vaping Use: Never used  Substance Use Topics   Alcohol use: No   Drug use: No     Allergies   Patient has no known allergies.   Review of Systems Review of Systems  Constitutional:  Negative for fatigue and fever.  Respiratory:  Negative for shortness of breath.   Cardiovascular:  Negative for chest pain.  Gastrointestinal:  Negative for abdominal pain, nausea and vomiting.  Genitourinary:  Positive for hematuria. Negative for decreased urine volume, difficulty urinating, dysuria, genital sores, penile discharge and testicular pain.  Musculoskeletal:  Negative for back pain.  Neurological:  Negative for dizziness and weakness.    Physical Exam Triage Vital Signs ED Triage Vitals  Enc Vitals Group     BP 11/18/20 1048 121/75     Pulse Rate 11/18/20 1048 77     Resp 11/18/20 1048 20     Temp 11/18/20 1048 98.5 F (36.9 C)     Temp src --      SpO2 11/18/20 1048 96 %     Weight --      Height --      Head Circumference --      Peak Flow --      Pain Score 11/18/20 1045 0     Pain Loc --      Pain Edu? --      Excl. in [place]? --    No data found.  Updated Vital Signs BP 121/75   Pulse 77   Temp 98.5 F (36.9 C)    Resp 20   SpO2 96%      Physical Exam Vitals and nursing note reviewed.  Constitutional:      General: He is not in acute distress.    Appearance: Normal appearance. He is well-developed. He is not ill-appearing.  HENT:     Head: Normocephalic and atraumatic.  Eyes:  General: No scleral icterus.    Conjunctiva/sclera: Conjunctivae normal.  Cardiovascular:     Rate and Rhythm: Normal rate and regular rhythm.     Heart sounds: Normal heart sounds.  Pulmonary:     Effort: Pulmonary effort is normal. No respiratory distress.     Breath sounds: Normal breath sounds.  Abdominal:     Palpations: Abdomen is soft.     Tenderness: There is no abdominal tenderness. There is no right CVA tenderness or left CVA tenderness.  Musculoskeletal:     Cervical back: Neck supple.  Skin:    General: Skin is warm and dry.  Neurological:     General: No focal deficit present.     Mental Status: He is alert. Mental status is at baseline.     Motor: No weakness.     Coordination: Coordination normal.     Gait: Gait normal.  Psychiatric:        Mood and Affect: Mood normal.        Thought Content: Thought content normal.     UC Treatments / Results  Labs (all labs ordered are listed, but only abnormal results are displayed) Labs Reviewed  URINALYSIS, COMPLETE (UACMP) WITH MICROSCOPIC - Abnormal; Notable for the following components:      Result Value   Color, Urine AMBER (*)    APPearance CLOUDY (*)    Hgb urine dipstick LARGE (*)    Protein, ur 100 (*)    All other components within normal limits    EKG   Radiology DG Abdomen 1 View  Result Date: 11/18/2020 CLINICAL DATA:  70 year old male with hematuria. EXAM: ABDOMEN - 1 VIEW COMPARISON:  Lumbar radiographs 09/14/2013. Chest radiographs 08/09/2017. FINDINGS: A left hemipelvis phlebolith along the left lateral margin of the bladder is stable since 2015. But there is a new small round 3 mm calculus projecting near the expected left  ureterovesical junction. And there is evidence of similar small left nephrolithiasis at the level of the renal shadow, although this projects over stool in the transverse colon. No suspicious calcifications on the right side. Non obstructed bowel gas pattern. Retained stool throughout the transverse colon and both flexures. Negative visible lung bases. Chronic left upper quadrant surgical clips. Partially visible sternotomy. T12 compression fracture is new since 2015 but probably chronic. No definite No acute osseous abnormality identified. IMPRESSION: 1. Appearance suspicious for left side nephrolithiasis and distal ureteral calculus, although artifact from vascular calcifications is possible. Noncontrast CT Abdomen and Pelvis would best evaluate further. 2. T12 compression fracture, new since 2015 but probably chronic. Electronically Signed   By: Genevie Ann M.D.   On: 11/18/2020 11:42    Procedures Procedures (including critical care time)  Medications Ordered in UC Medications - No data to display  Initial Impression / Assessment and Plan / UC Course  I have reviewed the triage vital signs and the nursing notes.  Pertinent labs & imaging results that were available during my care of the patient were reviewed by me and considered in my medical decision making (see chart for details).  70 year old male presenting for hematuria that he noticed this morning.  No other symptoms reported.  Does have history of kidney stones.  Vital signs all normal and stable patient is overall well-appearing.  His exam is benign.  He does not have any abdominal or CVA tenderness.  Urinalysis shows cloudy urine with large blood and protein.  No evidence of infection.  KUB ordered to assess for possible nephrolithiasis.  KUB result does come back suspicious for left-sided nephrolithiasis and distal ureteral calculus.  3 mm calculus at the left UVJ.  Given his history of kidney stones and the findings of the KUB,  suspect hematuria coming from the kidney stones.  I have sent in Flomax and advised him to increase his fluid intake.  Patient also given strainer.  I did place a referral to [facility] in [place] because patient thought he might be able to get into see them sooner than his normal urologist since it has been a while.  I thoroughly reviewed ED red flag signs and symptoms with patient.   Final Clinical Impressions(s) / UC Diagnoses   Final diagnoses:  Gross hematuria  Nephrolithiasis     Discharge Instructions      Your urinalysis does not show any evidence of a urinary tract infection.  There is obvious with blood though.  We obtained an x-ray of your abdomen to assess for possible kidney stones.  There do appear to be 2 kidney stones of the left side.  I have sent in a medication to help you pass the stones.  Increase your fluid intake.  If you have any pain you can take ibuprofen and/or Tylenol for discomfort.  I have placed referral to urology for you in case you continue to have blood in the urine and are unable to pass the stone.  If you have any severe acute worsening back pain, fever, weakness, excessive vomiting you need to be evaluated in the emergency department.     ED Prescriptions     Medication Sig Dispense Auth. Provider   tamsulosin (FLOMAX) 0.4 MG CAPS capsule Take 1 capsule (0.4 mg total) by mouth daily. 30 capsule Danton Clap, PA-C      PDMP not reviewed this encounter.   Danton Clap, PA-C 11/18/20 1200

## 2020-11-18 NOTE — Discharge Instructions (Addendum)
Your urinalysis does not show any evidence of a urinary tract infection.  There is obvious with blood though.  We obtained an x-ray of your abdomen to assess for possible kidney stones.  There do appear to be 2 kidney stones of the left side.  I have sent in a medication to help you pass the stones.  Increase your fluid intake.  If you have any pain you can take ibuprofen and/or Tylenol for discomfort.  I have placed referral to urology for you in case you continue to have blood in the urine and are unable to pass the stone.  If you have any severe acute worsening back pain, fever, weakness, excessive vomiting you need to be evaluated in the emergency department.

## 2020-11-18 NOTE — ED Triage Notes (Signed)
Pt presents with c/o blood in urine that he noticed this morning, denies any pain

## 2020-11-19 DIAGNOSIS — N202 Calculus of kidney with calculus of ureter: Secondary | ICD-10-CM | POA: Diagnosis not present

## 2020-11-26 DIAGNOSIS — I2510 Atherosclerotic heart disease of native coronary artery without angina pectoris: Secondary | ICD-10-CM | POA: Diagnosis not present

## 2021-02-06 DIAGNOSIS — G4734 Idiopathic sleep related nonobstructive alveolar hypoventilation: Secondary | ICD-10-CM | POA: Diagnosis not present

## 2021-02-06 DIAGNOSIS — E7800 Pure hypercholesterolemia, unspecified: Secondary | ICD-10-CM | POA: Diagnosis not present

## 2021-02-06 DIAGNOSIS — I10 Essential (primary) hypertension: Secondary | ICD-10-CM | POA: Diagnosis not present

## 2021-02-06 DIAGNOSIS — Z794 Long term (current) use of insulin: Secondary | ICD-10-CM | POA: Diagnosis not present

## 2021-02-06 DIAGNOSIS — R002 Palpitations: Secondary | ICD-10-CM | POA: Diagnosis not present

## 2021-02-06 DIAGNOSIS — E119 Type 2 diabetes mellitus without complications: Secondary | ICD-10-CM | POA: Diagnosis not present

## 2021-02-06 DIAGNOSIS — I2510 Atherosclerotic heart disease of native coronary artery without angina pectoris: Secondary | ICD-10-CM | POA: Diagnosis not present

## 2021-02-25 DIAGNOSIS — N202 Calculus of kidney with calculus of ureter: Secondary | ICD-10-CM | POA: Diagnosis not present

## 2021-02-25 DIAGNOSIS — R3915 Urgency of urination: Secondary | ICD-10-CM | POA: Diagnosis not present

## 2021-02-25 DIAGNOSIS — N5201 Erectile dysfunction due to arterial insufficiency: Secondary | ICD-10-CM | POA: Diagnosis not present

## 2021-02-25 DIAGNOSIS — N403 Nodular prostate with lower urinary tract symptoms: Secondary | ICD-10-CM | POA: Diagnosis not present

## 2021-03-12 DIAGNOSIS — E1159 Type 2 diabetes mellitus with other circulatory complications: Secondary | ICD-10-CM | POA: Diagnosis not present

## 2021-03-14 DIAGNOSIS — Z13220 Encounter for screening for lipoid disorders: Secondary | ICD-10-CM | POA: Diagnosis not present

## 2021-03-14 DIAGNOSIS — E1159 Type 2 diabetes mellitus with other circulatory complications: Secondary | ICD-10-CM | POA: Diagnosis not present

## 2021-03-21 DIAGNOSIS — I10 Essential (primary) hypertension: Secondary | ICD-10-CM | POA: Diagnosis not present

## 2021-03-21 DIAGNOSIS — E1165 Type 2 diabetes mellitus with hyperglycemia: Secondary | ICD-10-CM | POA: Diagnosis not present

## 2021-03-21 DIAGNOSIS — E7800 Pure hypercholesterolemia, unspecified: Secondary | ICD-10-CM | POA: Diagnosis not present

## 2021-03-21 DIAGNOSIS — I700 Atherosclerosis of aorta: Secondary | ICD-10-CM | POA: Diagnosis not present

## 2021-03-21 DIAGNOSIS — N401 Enlarged prostate with lower urinary tract symptoms: Secondary | ICD-10-CM | POA: Diagnosis not present

## 2021-08-06 DIAGNOSIS — E119 Type 2 diabetes mellitus without complications: Secondary | ICD-10-CM | POA: Diagnosis not present

## 2021-08-06 DIAGNOSIS — I2510 Atherosclerotic heart disease of native coronary artery without angina pectoris: Secondary | ICD-10-CM | POA: Diagnosis not present

## 2021-08-06 DIAGNOSIS — Z794 Long term (current) use of insulin: Secondary | ICD-10-CM | POA: Diagnosis not present

## 2021-08-06 DIAGNOSIS — I10 Essential (primary) hypertension: Secondary | ICD-10-CM | POA: Diagnosis not present

## 2021-08-06 DIAGNOSIS — I700 Atherosclerosis of aorta: Secondary | ICD-10-CM | POA: Diagnosis not present

## 2021-08-06 DIAGNOSIS — R002 Palpitations: Secondary | ICD-10-CM | POA: Diagnosis not present

## 2021-08-06 DIAGNOSIS — G4734 Idiopathic sleep related nonobstructive alveolar hypoventilation: Secondary | ICD-10-CM | POA: Diagnosis not present

## 2021-08-06 DIAGNOSIS — E7800 Pure hypercholesterolemia, unspecified: Secondary | ICD-10-CM | POA: Diagnosis not present

## 2021-08-12 DIAGNOSIS — H40003 Preglaucoma, unspecified, bilateral: Secondary | ICD-10-CM | POA: Diagnosis not present

## 2021-09-12 DIAGNOSIS — E7800 Pure hypercholesterolemia, unspecified: Secondary | ICD-10-CM | POA: Diagnosis not present

## 2021-09-19 DIAGNOSIS — N401 Enlarged prostate with lower urinary tract symptoms: Secondary | ICD-10-CM | POA: Diagnosis not present

## 2021-09-19 DIAGNOSIS — I700 Atherosclerosis of aorta: Secondary | ICD-10-CM | POA: Diagnosis not present

## 2021-09-19 DIAGNOSIS — I10 Essential (primary) hypertension: Secondary | ICD-10-CM | POA: Diagnosis not present

## 2021-09-19 DIAGNOSIS — E1165 Type 2 diabetes mellitus with hyperglycemia: Secondary | ICD-10-CM | POA: Diagnosis not present

## 2021-09-19 DIAGNOSIS — Z0000 Encounter for general adult medical examination without abnormal findings: Secondary | ICD-10-CM | POA: Diagnosis not present

## 2021-09-19 DIAGNOSIS — E7800 Pure hypercholesterolemia, unspecified: Secondary | ICD-10-CM | POA: Diagnosis not present

## 2021-10-22 ENCOUNTER — Telehealth: Payer: Self-pay

## 2021-10-22 NOTE — Telephone Encounter (Signed)
Gastroenterology Pre-Procedure Review  Request Date: 12/19/21 Requesting Physician: Dr. Vicente Males  PATIENT REVIEW QUESTIONS: The patient responded to the following health history questions as indicated:    1. Are you having any GI issues? no 2. Do you have a personal history of Polyps? Yes, 11/30/16 Dr. Vira Agar noted "A small polyp was found in the ascending colon. The polyp was sessile".  The colonoscopy results report indicated its due 12/01/26. 3. Do you have a family history of Colon Cancer or Polyps? no 4. Diabetes Mellitus? yes 5. Joint replacements in the past 12 months?no 6. Major health problems in the past 3 months?no 7. Any artificial heart valves, MVP, or defibrillator?no    MEDICATIONS & ALLERGIES:    Patient reports the following regarding taking any anticoagulation/antiplatelet therapy:   Plavix, Coumadin, Eliquis, Xarelto, Lovenox, Pradaxa, Brilinta, or Effient? no Aspirin? Aspirin '81mg'$  and '325mg'$  to be clarified after calling pt back  Patient confirms/reports the following medications:  Current Outpatient Medications  Medication Sig Dispense Refill   acetaminophen (TYLENOL) 500 MG tablet Take 2 tablets (1,000 mg total) by mouth every 6 (six) hours as needed for mild pain or fever. 30 tablet 0   albuterol (PROVENTIL HFA;VENTOLIN HFA) 108 (90 Base) MCG/ACT inhaler Inhale 1-2 puffs into the lungs every 6 (six) hours as needed for wheezing. 1 Inhaler 0   amiodarone (PACERONE) 200 MG tablet Take 1 tablet (200 mg total) by mouth 2 (two) times daily after a meal. For 7 Days, then decrease to 200 mg daiy 60 tablet 1   aspirin 81 MG EC tablet Take by mouth.     aspirin EC 325 MG EC tablet Take 1 tablet (325 mg total) by mouth daily. 30 tablet 0   benzonatate (TESSALON) 200 MG capsule Take 1 capsule (200 mg total) by mouth 3 (three) times daily as needed. (Patient not taking: Reported on 08/09/2017) 30 capsule 0   ketoconazole (NIZORAL) 2 % cream Apply 1 application topically daily as  needed for irritation.      metFORMIN (GLUCOPHAGE) 500 MG tablet Take by mouth.     metoprolol tartrate (LOPRESSOR) 25 MG tablet Take 25 mg by mouth 2 (two) times daily.     pravastatin (PRAVACHOL) 10 MG tablet Take 10 mg by mouth daily.     ramipril (ALTACE) 5 MG capsule Take 1 capsule (5 mg total) by mouth daily. 30 capsule 3   Semaglutide,0.25 or 0.'5MG'$ /DOS, (OZEMPIC, 0.25 OR 0.5 MG/DOSE,) 2 MG/1.5ML SOPN Inject into the skin.     No current facility-administered medications for this visit.    Patient confirms/reports the following allergies:  No Known Allergies  No orders of the defined types were placed in this encounter.   AUTHORIZATION INFORMATION Primary Insurance: 1D#: Group #:  Secondary Insurance: 1D#: Group #:  SCHEDULE INFORMATION: Date: 12/19/21 Time: Location: [facility]

## 2021-10-23 ENCOUNTER — Other Ambulatory Visit: Payer: Self-pay

## 2021-10-23 ENCOUNTER — Telehealth: Payer: Self-pay

## 2021-10-23 DIAGNOSIS — Z86010 Personal history of colonic polyps: Secondary | ICD-10-CM

## 2021-10-23 MED ORDER — NA SULFATE-K SULFATE-MG SULF 17.5-3.13-1.6 GM/177ML PO SOLN: 1.0000 | Freq: Once | ORAL | 0 refills | Status: AC

## 2021-10-23 NOTE — Telephone Encounter (Signed)
Returned call to patient in regards to due date for repeat colonoscopy.  LVM on cell that colonoscopy is due and we will keep as scheduled for 12/19/21 with Dr. Vicente Males.  Asked him to call back if he has any questions.  Thanks, [place], [place]

## 2021-12-19 ENCOUNTER — Ambulatory Visit: Payer: PPO | Admitting: Anesthesiology

## 2021-12-19 ENCOUNTER — Other Ambulatory Visit: Payer: Self-pay

## 2021-12-19 ENCOUNTER — Encounter
Admission: RE | Disposition: A | Discharge status: Home / Self Care | Payer: Self-pay | Source: Home / Self Care | Attending: Gastroenterology

## 2021-12-19 ENCOUNTER — Encounter: Payer: Self-pay | Admitting: Gastroenterology

## 2021-12-19 ENCOUNTER — Ambulatory Visit
Admission: RE | Admit: 2021-12-19 | Discharge: 2021-12-19 | Disposition: A | Discharge status: Home / Self Care | Payer: PPO | Attending: Gastroenterology | Admitting: Gastroenterology

## 2021-12-19 DIAGNOSIS — G4730 Sleep apnea, unspecified: Secondary | ICD-10-CM | POA: Insufficient documentation

## 2021-12-19 DIAGNOSIS — Z951 Presence of aortocoronary bypass graft: Secondary | ICD-10-CM | POA: Insufficient documentation

## 2021-12-19 DIAGNOSIS — D122 Benign neoplasm of ascending colon: Secondary | ICD-10-CM | POA: Diagnosis not present

## 2021-12-19 DIAGNOSIS — D126 Benign neoplasm of colon, unspecified: Secondary | ICD-10-CM | POA: Diagnosis not present

## 2021-12-19 DIAGNOSIS — Z7984 Long term (current) use of oral hypoglycemic drugs: Secondary | ICD-10-CM | POA: Insufficient documentation

## 2021-12-19 DIAGNOSIS — I2510 Atherosclerotic heart disease of native coronary artery without angina pectoris: Secondary | ICD-10-CM | POA: Insufficient documentation

## 2021-12-19 DIAGNOSIS — Z87891 Personal history of nicotine dependence: Secondary | ICD-10-CM | POA: Diagnosis not present

## 2021-12-19 DIAGNOSIS — Z86010 Personal history of colonic polyps: Secondary | ICD-10-CM | POA: Diagnosis not present

## 2021-12-19 DIAGNOSIS — I10 Essential (primary) hypertension: Secondary | ICD-10-CM | POA: Insufficient documentation

## 2021-12-19 DIAGNOSIS — D120 Benign neoplasm of cecum: Secondary | ICD-10-CM | POA: Insufficient documentation

## 2021-12-19 DIAGNOSIS — E119 Type 2 diabetes mellitus without complications: Secondary | ICD-10-CM | POA: Insufficient documentation

## 2021-12-19 DIAGNOSIS — Z1211 Encounter for screening for malignant neoplasm of colon: Secondary | ICD-10-CM | POA: Insufficient documentation

## 2021-12-19 HISTORY — PX: COLONOSCOPY WITH PROPOFOL: SHX5780

## 2021-12-19 SURGERY — COLONOSCOPY WITH PROPOFOL
Anesthesia: General

## 2021-12-19 MED ORDER — ONDANSETRON HCL 4 MG/2ML IJ SOLN
INTRAMUSCULAR | Status: AC
  Filled 2021-12-19: qty 2

## 2021-12-19 MED ORDER — ONDANSETRON HCL 4 MG/2ML IJ SOLN
INTRAMUSCULAR | Status: DC | PRN
  Administered 2021-12-19: 4 mg via INTRAVENOUS

## 2021-12-19 MED ORDER — DEXMEDETOMIDINE HCL IN NACL 200 MCG/50ML IV SOLN
INTRAVENOUS | Status: DC | PRN
  Administered 2021-12-19: 20 ug via INTRAVENOUS

## 2021-12-19 MED ORDER — SODIUM CHLORIDE 0.9 % IV SOLN: INTRAVENOUS | Status: DC

## 2021-12-19 MED ORDER — SUCCINYLCHOLINE CHLORIDE 200 MG/10ML IV SOSY
PREFILLED_SYRINGE | INTRAVENOUS | Status: DC | PRN
  Administered 2021-12-19: 100 mg via INTRAVENOUS

## 2021-12-19 MED ORDER — PROPOFOL 10 MG/ML IV BOLUS
INTRAVENOUS | Status: DC | PRN
  Administered 2021-12-19: 150 mg via INTRAVENOUS

## 2021-12-19 NOTE — Anesthesia Preprocedure Evaluation (Addendum)
Anesthesia Evaluation  Patient identified by MRN, date of birth, ID band Patient awake    Reviewed: Allergy & Precautions, NPO status , Patient's Chart, lab work & pertinent test results  History of Anesthesia Complications Negative for: history of anesthetic complications  Airway Mallampati: I  TM Distance: >3 FB Neck ROM: Full    Dental no notable dental hx.    Pulmonary sleep apnea , former smoker,    Pulmonary exam normal breath sounds clear to auscultation       Cardiovascular Exercise Tolerance: Good hypertension, (-) angina+ CAD and + CABG  (-) DOE Normal cardiovascular exam Rhythm:Regular Rate:Normal     Neuro/Psych negative neurological ROS  negative psych ROS   GI/Hepatic negative GI ROS, Neg liver ROS,   Endo/Other  negative endocrine ROSdiabetes  Renal/GU negative Renal ROS  negative genitourinary   Musculoskeletal negative musculoskeletal ROS (+)   Abdominal   Peds negative pediatric ROS (+)  Hematology negative hematology ROS (+)   Anesthesia Other Findings Ozempic last taken 9/9   Reproductive/Obstetrics negative OB ROS                             Anesthesia Physical Anesthesia Plan  ASA: 3  Anesthesia Plan: General ETT   Post-op Pain Management: Minimal or no pain anticipated   Induction: Intravenous  PONV Risk Score and Plan: 2 and Ondansetron and Dexamethasone  Airway Management Planned: Oral ETT  Additional Equipment:   Intra-op Plan:   Post-operative Plan: Extubation in OR  Informed Consent: I have reviewed the patients History and Physical, chart, labs and discussed the procedure including the risks, benefits and alternatives for the proposed anesthesia with the patient or authorized representative who has indicated his/her understanding and acceptance.     Dental Advisory Given  Plan Discussed with: Anesthesiologist, CRNA and  Surgeon  Anesthesia Plan Comments: (Patient consented for risks of anesthesia including but not limited to:  - adverse reactions to medications - damage to eyes, teeth, lips or other oral mucosa - nerve damage due to positioning  - sore throat or hoarseness - Damage to heart, brain, nerves, lungs, other parts of body or loss of life  Patient voiced understanding.  Discussed aspiration risks associated with perioperative Ozempic use. Offered GETA vs rescheduling. Patient would like to proceed with case. )       Anesthesia Quick Evaluation

## 2021-12-19 NOTE — Anesthesia Postprocedure Evaluation (Signed)
Anesthesia Post Note  Patient: Phillip Le  Procedure(s) Performed: COLONOSCOPY WITH PROPOFOL  Patient location during evaluation: Endoscopy Anesthesia Type: General Level of consciousness: awake and alert Pain management: pain level controlled Vital Signs Assessment: post-procedure vital signs reviewed and stable Respiratory status: spontaneous breathing, nonlabored ventilation, respiratory function stable and patient connected to nasal cannula oxygen Cardiovascular status: blood pressure returned to baseline and stable Postop Assessment: no apparent nausea or vomiting Anesthetic complications: no   No notable events documented.   Last Vitals:  Vitals:   12/19/21 1047 12/19/21 1051  BP: 117/78 111/77  Pulse:    Resp: 16   Temp:    SpO2: 100%     Last Pain:  Vitals:   12/19/21 1101  TempSrc:   PainSc: 0-No pain                 Ilene Qua

## 2021-12-19 NOTE — Transfer of Care (Signed)
Immediate Anesthesia Transfer of Care Note  Patient: Phillip Le  Procedure(s) Performed: COLONOSCOPY WITH PROPOFOL  Patient Location: PACU and Endoscopy Unit  Anesthesia Type:General  Level of Consciousness: awake, alert  and oriented  Airway & Oxygen Therapy: Patient Spontanous Breathing  Post-op Assessment: Report given to RN and Post -op Vital signs reviewed and stable  Post vital signs: Reviewed and stable  Last Vitals:  Vitals Value Taken Time  BP 117/78 12/19/21 1047  Temp 36.3 C 12/19/21 1041  Pulse 57 12/19/21 1048  Resp 13 12/19/21 1048  SpO2 100 % 12/19/21 1048  Vitals shown include unvalidated device data.  Last Pain:  Vitals:   12/19/21 1041  TempSrc: Temporal  PainSc: 0-No pain         Complications: No notable events documented.

## 2021-12-19 NOTE — H&P (Signed)
Phillip Bellows, MD [address] [place], [place], [place], [address] [id number], [place], [place], [place], [address] Phone: [phone]  Fax: [phone]  Primary Care Physician:  Sofie Hartigan, MD   Pre-Procedure History & Physical: HPI:  Phillip Le is a 71 y.o. male is here for an colonoscopy.   Past Medical History:  Diagnosis Date   Allergy    Arthritis    Coronary artery disease    Diabetes mellitus without complication ([place])    Dysrhythmia    Elevated lipids    Gout    History of kidney stones    Hypertension    Sleep apnea    Wears glasses     Past Surgical History:  Procedure Laterality Date   ADENOIDECTOMY     APPENDECTOMY     COLONOSCOPY WITH PROPOFOL N/A 11/30/2016   Procedure: COLONOSCOPY WITH PROPOFOL;  Surgeon: Manya Silvas, MD;  Location: [facility] ENDOSCOPY;  Service: Endoscopy;  Laterality: N/A;   CORONARY ARTERY BYPASS GRAFT N/A 06/23/2017   Procedure: CORONARY ARTERY BYPASS GRAFTING (CABG) x 4, on pump,LIMA to LAD, SVG to OM, SVG to DIAGONAL, SVG to DISTAL RCA, using left internal mammary artery and right greater saphenous vein harvested endoscopically;  Surgeon: Grace Isaac, MD;  Location: [facility];  Service: Open Heart Surgery;  Laterality: N/A;   LEFT HEART CATH AND CORONARY ANGIOGRAPHY Left 06/18/2017   Procedure: LEFT HEART CATH AND CORONARY ANGIOGRAPHY;  Surgeon: Teodoro Spray, MD;  Location: [facility] CV LAB;  Service: Cardiovascular;  Laterality: Left;   SHOULDER SURGERY Right    approx 8-10 years ago   TEE WITHOUT CARDIOVERSION N/A 06/23/2017   Procedure: TRANSESOPHAGEAL ECHOCARDIOGRAM (TEE);  Surgeon: Grace Isaac, MD;  Location: [facility];  Service: Open Heart Surgery;  Laterality: N/A;   TONSILLECTOMY      Prior to Admission medications   Medication Sig Start Date End Date Taking? Authorizing Provider  aspirin 81 MG EC tablet Take by mouth.   Yes [provider]  metFORMIN (GLUCOPHAGE) 500 MG tablet Take  by mouth. 10/13/19  Yes [provider]  metoprolol tartrate (LOPRESSOR) 25 MG tablet Take 25 mg by mouth 2 (two) times daily. 06/08/17  Yes [provider]  pravastatin (PRAVACHOL) 10 MG tablet Take 10 mg by mouth daily.   Yes [provider]  ramipril (ALTACE) 5 MG capsule Take 1 capsule (5 mg total) by mouth daily. 06/28/17  Yes Barrett, Erin R, PA-C  Semaglutide,0.25 or 0.'5MG'$ /DOS, (OZEMPIC, 0.25 OR 0.5 MG/DOSE,) 2 MG/1.5ML SOPN Inject into the skin.   Yes [provider]  acetaminophen (TYLENOL) 500 MG tablet Take 2 tablets (1,000 mg total) by mouth every 6 (six) hours as needed for mild pain or fever. 06/27/17   Barrett, Erin R, PA-C  albuterol (PROVENTIL HFA;VENTOLIN HFA) 108 (90 Base) MCG/ACT inhaler Inhale 1-2 puffs into the lungs every 6 (six) hours as needed for wheezing. Patient not taking: Reported on 12/19/2021 05/24/17   Norval Gable, MD  amiodarone (PACERONE) 200 MG tablet Take 1 tablet (200 mg total) by mouth 2 (two) times daily after a meal. For 7 Days, then decrease to 200 mg daiy Patient not taking: Reported on 12/19/2021 06/27/17   Barrett, Lodema Hong, PA-C  aspirin EC 325 MG EC tablet Take 1 tablet (325 mg total) by mouth daily. Patient not taking: Reported on 12/19/2021 06/28/17   Barrett, Lodema Hong, PA-C  benzonatate (TESSALON) 200 MG capsule Take 1 capsule (200 mg total)  by mouth 3 (three) times daily as needed. Patient not taking: Reported on 08/09/2017 05/24/17   Norval Gable, MD  ketoconazole (NIZORAL) 2 % cream Apply 1 application topically daily as needed for irritation.  01/30/15   [provider]    Allergies as of 10/23/2021   (No Known Allergies)    Family History  Problem Relation Age of Onset   CAD Mother    Cancer Father     Social History   Socioeconomic History   Marital status: Married    Spouse name: Not on file   Number of children: Not on file   Years of education: Not on file   Highest education level: Not on file   Occupational History   Not on file  Tobacco Use   Smoking status: Former    Types: Cigarettes    Quit date: 06/19/1978    Years since quitting: 43.5   Smokeless tobacco: Never  Vaping Use   Vaping Use: Never used  Substance and Sexual Activity   Alcohol use: No   Drug use: No   Sexual activity: Not on file  Other Topics Concern   Not on file  Social History Narrative   Not on file   Social Determinants of Health   Financial Resource Strain: Not on file  Food Insecurity: Not on file  Transportation Needs: Not on file  Physical Activity: Not on file  Stress: Not on file  Social Connections: Not on file  Intimate Partner Violence: Not on file    Review of Systems: See HPI, otherwise negative ROS  Physical Exam: BP 138/73   Pulse 65   Temp (!) 97.2 F (36.2 C) (Temporal)   Resp 18   Ht '5\' 8"'$  (1.727 m)   Wt 78.9 kg   SpO2 100%   BMI 26.46 kg/m  General:   Alert,  pleasant and cooperative in NAD Head:  Normocephalic and atraumatic. Neck:  Supple; no masses or thyromegaly. Lungs:  Clear throughout to auscultation, normal respiratory effort.    Heart:  +S1, +S2, Regular rate and rhythm, No edema. Abdomen:  Soft, nontender and nondistended. Normal bowel sounds, without guarding, and without rebound.   Neurologic:  Alert and  oriented x4;  grossly normal neurologically.  Impression/Plan: Phillip Le is here for an colonoscopy to be performed for surveillance due to prior history of colon polyps   Risks, benefits, limitations, and alternatives regarding  colonoscopy have been reviewed with the patient.  Questions have been answered.  All parties agreeable.   Phillip Bellows, MD  12/19/2021, 9:58 AM

## 2021-12-19 NOTE — Op Note (Signed)
[facility] Gastroenterology Patient Name: Phillip Le Procedure Date: 12/19/2021 9:51 AM MRN: [id number] Account #: [id number] Date of Birth: 01-30-1951 Admit Type: Outpatient Age: 71 Room: [facility] ENDO ROOM 4 Gender: Male Note Status: Finalized Instrument Name: Jasper Riling [id number] Procedure:             Colonoscopy Indications:           Surveillance: Personal history of adenomatous polyps                         on last colonoscopy 5 years ago Providers:             [name] MD, MD Medicines:             Monitored Anesthesia Care Complications:         No immediate complications. Procedure:             Pre-Anesthesia Assessment:                        - Prior to the procedure, a History and Physical was                         performed, and patient medications, allergies and                         sensitivities were reviewed. The patient's tolerance                         of previous anesthesia was reviewed.                        - The risks and benefits of the procedure and the                         sedation options and risks were discussed with the                         patient. All questions were answered and informed                         consent was obtained.                        - ASA Grade Assessment: II - A patient with mild                         systemic disease.                        After obtaining informed consent, the colonoscope was                         passed under direct vision. Throughout the procedure,                         the patient's blood pressure, pulse, and oxygen                         saturations were monitored continuously. The  Colonoscope was introduced through the anus and                         advanced to the the cecum, identified by the                         appendiceal orifice. The colonoscopy was performed                         with ease. The patient tolerated the procedure  well.                         The quality of the bowel preparation was adequate. Findings:      The perianal and digital rectal examinations were normal.      Three sessile polyps were found in the cecum. The polyps were 5 to 6 mm       in size. These polyps were removed with a cold snare. Resection and       retrieval were complete.      Two sessile polyps were found in the ascending colon. The polyps were 4       to 5 mm in size. These polyps were removed with a cold snare. Resection       and retrieval were complete.      The exam was otherwise without abnormality on direct and retroflexion       views. Impression:            - Three 5 to 6 mm polyps in the cecum, removed with a                         cold snare. Resected and retrieved.                        - Two 4 to 5 mm polyps in the ascending colon, removed                         with a cold snare. Resected and retrieved.                        - The examination was otherwise normal on direct and                         retroflexion views. Recommendation:        - Discharge patient to home (with escort).                        - Resume previous diet.                        - Continue present medications.                        - Await pathology results.                        - Repeat colonoscopy in 3 years for surveillance. Procedure Code(s):     --- Professional ---  45385, Colonoscopy, flexible; with removal of                         tumor(s), polyp(s), or other lesion(s) by snare                         technique Diagnosis Code(s):     --- Professional ---                        Z86.010, Personal history of colonic polyps                        K63.5, Polyp of colon CPT copyright 2019 American Medical Association. All rights reserved. The codes documented in this report are preliminary and upon coder review may  be revised to meet current compliance requirements. [name], MD [name] MD,  MD 12/19/2021 10:35:11 AM This report has been signed electronically. Number of Addenda: 0 Note Initiated On: 12/19/2021 9:51 AM Scope Withdrawal Time: 0 hours 14 minutes 14 seconds  Total Procedure Duration: 0 hours 18 minutes 3 seconds  Estimated Blood Loss:  Estimated blood loss: none.      [facility]

## 2021-12-19 NOTE — Anesthesia Procedure Notes (Signed)
Procedure Name: Intubation Date/Time: 12/19/2021 10:10 AM  Performed by: Jonna Clark, CRNAPre-anesthesia Checklist: Patient identified, Patient being monitored, Timeout performed, Emergency Drugs available and Suction available Patient Re-evaluated:Patient Re-evaluated prior to induction Oxygen Delivery Method: Circle system utilized Preoxygenation: Pre-oxygenation with 100% oxygen Induction Type: IV induction and Rapid sequence Ventilation: Mask ventilation without difficulty Laryngoscope Size: 3 and McGraph Grade View: Grade I Tube type: Oral Tube size: 7.0 mm Number of attempts: 1 Airway Equipment and Method: Stylet Placement Confirmation: ETT inserted through vocal cords under direct vision, positive ETCO2 and breath sounds checked- equal and bilateral Secured at: 21 cm Tube secured with: Tape Dental Injury: Teeth and Oropharynx as per pre-operative assessment

## 2021-12-22 ENCOUNTER — Encounter: Payer: Self-pay | Admitting: Gastroenterology

## 2021-12-22 LAB — SURGICAL PATHOLOGY

## 2022-04-08 DIAGNOSIS — I700 Atherosclerosis of aorta: Secondary | ICD-10-CM | POA: Diagnosis not present

## 2022-04-08 DIAGNOSIS — I2510 Atherosclerotic heart disease of native coronary artery without angina pectoris: Secondary | ICD-10-CM | POA: Diagnosis not present

## 2022-04-08 DIAGNOSIS — E1165 Type 2 diabetes mellitus with hyperglycemia: Secondary | ICD-10-CM | POA: Diagnosis not present

## 2022-04-08 DIAGNOSIS — E7800 Pure hypercholesterolemia, unspecified: Secondary | ICD-10-CM | POA: Diagnosis not present

## 2022-04-08 DIAGNOSIS — I10 Essential (primary) hypertension: Secondary | ICD-10-CM | POA: Diagnosis not present

## 2022-04-08 DIAGNOSIS — N401 Enlarged prostate with lower urinary tract symptoms: Secondary | ICD-10-CM | POA: Diagnosis not present

## 2022-04-15 DIAGNOSIS — K4090 Unilateral inguinal hernia, without obstruction or gangrene, not specified as recurrent: Secondary | ICD-10-CM | POA: Diagnosis not present

## 2022-04-15 DIAGNOSIS — K429 Umbilical hernia without obstruction or gangrene: Secondary | ICD-10-CM | POA: Diagnosis not present

## 2022-04-15 DIAGNOSIS — I25810 Atherosclerosis of coronary artery bypass graft(s) without angina pectoris: Secondary | ICD-10-CM | POA: Diagnosis not present

## 2022-04-23 ENCOUNTER — Ambulatory Visit: Payer: Self-pay | Admitting: General Surgery

## 2022-04-24 NOTE — Patient Instructions (Addendum)
SURGICAL WAITING ROOM VISITATION  Patients having surgery or a procedure may have no more than 2 support people in the waiting area - these visitors may rotate.    Children under the age of 69 must have an adult with them who is not the patient.  Due to an increase in RSV and influenza rates and associated hospitalizations, children ages 62 and under may not visit patients in [place].  If the patient needs to stay at the hospital during part of their recovery, the visitor guidelines for inpatient rooms apply. Pre-op nurse will coordinate an appropriate time for 1 support person to accompany patient in pre-op.  This support person may not rotate.    Please refer to the [facility] website for the visitor guidelines for Inpatients (after your surgery is over and you are in a regular room).       Your procedure is scheduled on: 04-30-22   Report to [facility] Main Entrance    Report to admitting at      0830 AM   Call this number if you have problems the morning of surgery [phone]   Do not eat food :After Midnight.   After Midnight you may have the following liquids until _0740_____ AM DAY OF SURGERY  Water Non-Citrus Juices (without pulp, NO RED-Apple, White grape, White cranberry) Black Coffee (NO MILK/CREAM OR CREAMERS, sugar ok)  Clear Tea (NO MILK/CREAM OR CREAMERS, sugar ok) regular and decaf                             Plain Jell-O (NO RED)                                           Fruit ices (not with fruit pulp, NO RED)                                     Popsicles (NO RED)                                                               Sports drinks like Gatorade (NO RED)                   The day of surgery:  Drink ONE (1) Pre-Surgery G2 at    0740  AM the morning of surgery. Drink in one sitting. Do not sip.  This drink was given to you during your hospital  pre-op appointment visit. Nothing else to drink after completing the  Pre-Surgery   G2.          If you have questions, please contact your surgeon's office.   FOLLOW BOWEL PREP AND ANY ADDITIONAL PRE OP INSTRUCTIONS YOU RECEIVED FROM YOUR SURGEON'S OFFICE!!!     Oral Hygiene is also important to reduce your risk of infection.                                    Remember - BRUSH  YOUR TEETH THE MORNING OF SURGERY WITH YOUR REGULAR TOOTHPASTE  DENTURES WILL BE REMOVED PRIOR TO SURGERY PLEASE DO NOT APPLY "Poly grip" OR ADHESIVES!!!   Do NOT smoke after Midnight   Take these medicines the morning of surgery with A SIP OF WATER: none  DO NOT TAKE ANY ORAL DIABETIC MEDICATIONS DAY OF YOUR SURGERY  Bring CPAP mask and tubing day of surgery.                              You may not have any metal on your body including hair pins, jewelry, and body piercing             Do not wear lotions, powders, cologne, or deodorant                Men may shave face and neck.   Do not bring valuables to the hospital. [place].   Contacts, glasses, dentures or bridgework may not be worn into surgery.   Bring small overnight bag day of surgery.   DO NOT [place]. PHARMACY WILL DISPENSE MEDICATIONS LISTED ON YOUR MEDICATION LIST TO YOU DURING YOUR ADMISSION [place]!    Patients discharged on the day of surgery will not be allowed to drive home.  Someone NEEDS to stay with you for the first 24 hours after anesthesia.   Special Instructions: Bring a copy of your healthcare power of attorney and living will documents the day of surgery if you haven't scanned them before.              Please read over the following fact sheets you were given: IF [place] [phone]   If you received a COVID test during your pre-op visit  it is requested that you wear a mask when out in public, stay away from anyone that may not be feeling well and  notify your surgeon if you develop symptoms. If you test positive for Covid or have been in contact with anyone that has tested positive in the last 10 days please notify you surgeon.    [place] - Preparing for Surgery Before surgery, you can play an important role.  Because skin is not sterile, your skin needs to be as free of germs as possible.  You can reduce the number of germs on your skin by washing with CHG (chlorahexidine gluconate) soap before surgery.  CHG is an antiseptic cleaner which kills germs and bonds with the skin to continue killing germs even after washing. Please DO NOT use if you have an allergy to CHG or antibacterial soaps.  If your skin becomes reddened/irritated stop using the CHG and inform your nurse when you arrive at Short Stay. Do not shave (including legs and underarms) for at least 48 hours prior to the first CHG shower.  You may shave your face/neck. Please follow these instructions carefully:  1.  Shower with CHG Soap the night before surgery and the  morning of Surgery.  2.  If you choose to wash your hair, wash your hair first as usual with your  normal  shampoo.  3.  After you shampoo, rinse your hair and body thoroughly to remove the  shampoo.  4.  Use CHG as you would any other liquid soap.  You can apply chg directly  to the skin and wash                       Gently with a scrungie or clean washcloth.  5.  Apply the CHG Soap to your body ONLY FROM THE NECK DOWN.   Do not use on face/ open                           Wound or open sores. Avoid contact with eyes, ears mouth and genitals (private parts).                       Wash face,  Genitals (private parts) with your normal soap.             6.  Wash thoroughly, paying special attention to the area where your surgery  will be performed.  7.  Thoroughly rinse your body with warm water from the neck down.  8.  DO NOT shower/wash with your normal soap after using and rinsing off  the  CHG Soap.                9.  Pat yourself dry with a clean towel.            10.  Wear clean pajamas.            11.  Place clean sheets on your bed the night of your first shower and do not  sleep with pets. Day of Surgery : Do not apply any lotions/deodorants the morning of surgery.  Please wear clean clothes to the hospital/surgery center.  FAILURE TO FOLLOW THESE INSTRUCTIONS MAY RESULT IN THE CANCELLATION OF YOUR SURGERY PATIENT SIGNATURE_________________________________  NURSE SIGNATURE__________________________________  ________________________________________________________________________

## 2022-04-24 NOTE — Pre-Procedure Instructions (Addendum)
PCP - Thereasa Distance, MD [place] 04-08-22 epic Cardiologist - Bartholome Bill LOV 02-11-22 epic   PPM/ICD -  Device Orders -  Rep Notified -   Chest x-ray -  EKG - 04-28-22  Stress Test -  ECHO - 2019 Cardiac Cath - 2019 HgbA1c-04-08-22 epic, CMP 04-08-22 epic  Sleep Study -  CPAP -   Fasting Blood Sugar - 130's Checks Blood Sugar _1-2 ____ times a day  Blood Thinner Instructions: Aspirin Instructions:81 mg  ERAS Protcol - PRE-SURGERY G2-    COVID vaccine -no  NOT TAKING GLP-1 Agonist at this time due to the cost  Activity--Able to climb a flight of stairs without SOB Anesthesia review: CAD,CABG, HTN,DM  Patient denies shortness of breath, fever, cough and chest pain at PAT appointment   All instructions explained to the patient, with a verbal understanding of the material. Patient agrees to go over the instructions while at home for a better understanding. Patient also instructed to self quarantine after being tested for COVID-19. The opportunity to ask questions was provided.

## 2022-04-28 ENCOUNTER — Encounter (HOSPITAL_COMMUNITY): Payer: Self-pay

## 2022-04-28 ENCOUNTER — Encounter (HOSPITAL_COMMUNITY)
Admission: RE | Admit: 2022-04-28 | Discharge: 2022-04-28 | Disposition: A | Discharge status: Home / Self Care | Payer: PPO | Source: Ambulatory Visit | Attending: General Surgery | Admitting: General Surgery

## 2022-04-28 ENCOUNTER — Other Ambulatory Visit: Payer: Self-pay

## 2022-04-28 VITALS — BP 145/92 | HR 59 | Temp 98.1°F | Resp 16 | Ht 68.0 in | Wt 174.0 lb

## 2022-04-28 DIAGNOSIS — I10 Essential (primary) hypertension: Secondary | ICD-10-CM

## 2022-04-28 DIAGNOSIS — E119 Type 2 diabetes mellitus without complications: Secondary | ICD-10-CM | POA: Diagnosis not present

## 2022-04-28 DIAGNOSIS — Z01818 Encounter for other preprocedural examination: Secondary | ICD-10-CM | POA: Diagnosis not present

## 2022-04-28 LAB — CBC
HCT: 48.5 % (ref 39.0–52.0)
Hemoglobin: 16.7 g/dL (ref 13.0–17.0)
MCH: 31.5 pg (ref 26.0–34.0)
MCHC: 34.4 g/dL (ref 30.0–36.0)
MCV: 91.3 fL (ref 80.0–100.0)
Platelets: 126 10*3/uL — ABNORMAL LOW (ref 150–400)
RBC: 5.31 MIL/uL (ref 4.22–5.81)
RDW: 13.8 % (ref 11.5–15.5)
WBC: 5.7 10*3/uL (ref 4.0–10.5)
nRBC: 0 % (ref 0.0–0.2)

## 2022-04-28 LAB — BASIC METABOLIC PANEL
Anion gap: 5 (ref 5–15)
BUN: 16 mg/dL (ref 8–23)
CO2: 27 mmol/L (ref 22–32)
Calcium: 9.5 mg/dL (ref 8.9–10.3)
Chloride: 104 mmol/L (ref 98–111)
Creatinine, Ser: 0.63 mg/dL (ref 0.61–1.24)
GFR, Estimated: >60 mL/min (ref >60)
Glucose, Bld: 135 mg/dL — ABNORMAL HIGH (ref 70–99)
Potassium: 4.3 mmol/L (ref 3.5–5.1)
Sodium: 136 mmol/L (ref 135–145)

## 2022-04-28 LAB — GLUCOSE, CAPILLARY: Glucose-Capillary: 107 mg/dL — ABNORMAL HIGH (ref 70–99)

## 2022-04-29 NOTE — Progress Notes (Signed)
Anesthesia Chart Review   Case: [id number] Date/Time: 04/30/22 1029   Procedures:      RIGHT OPEN INGUINAL HERNIA REPAIR WITH MESH (Right)     OPEN UMBILICAL HERNIA REPAIR   Anesthesia type: General   Pre-op diagnosis: UNILATERAL INGUINAL HERNIA, UMBILICAL HERNIA   Location: [place] / [facility]   Surgeons: Kinsinger, Arta Bruce, MD       DISCUSSION:72 y.o. former smoker with h/o HTN, DM II (A1C 6.1), sleep apnea, CAD (CABG 2019), unilateral inguinal hernia, umbilical hernia scheduled for above procedure 04/30/2022 with Dr. Gurney Maxin.   Pt last seen by PCP 04/08/2022.   Pt last seen by cardiology 02/11/2022. Stable at this visit. CABG 2019, stress test 2022 with no ischemia.   VS: BP (!) 145/92   Pulse (!) 59   Temp 36.7 C (Oral)   Resp 16   Ht '5\' 8"'$  (1.727 m)   Wt 78.9 kg   SpO2 99%   BMI 26.46 kg/m   PROVIDERS: Sofie Hartigan, MD is PCP   Bartholome Bill, MD is Cardiologist  LABS: Labs reviewed: Acceptable for surgery. (all labs ordered are listed, but only abnormal results are displayed)  Labs Reviewed  BASIC METABOLIC PANEL - Abnormal; Notable for the following components:      Result Value   Glucose, Bld 135 (*)    All other components within normal limits  CBC - Abnormal; Notable for the following components:   Platelets 126 (*)    All other components within normal limits  GLUCOSE, CAPILLARY - Abnormal; Notable for the following components:   Glucose-Capillary 107 (*)    All other components within normal limits     IMAGES:   EKG:   CV: Myocardial Perfusion 11/26/2020 FINDINGS:  Regional wall motion:  reveals normal myocardial thickening and wall  motion.  The overall quality of the study is good.   Artifacts noted: no  Left ventricular cavity: normal.  Past Medical History:  Diagnosis Date   Allergy    Arthritis    Coronary artery disease    Diabetes mellitus without complication (HCC)    Dysrhythmia    palpitations   Elevated lipids     Gout    History of kidney stones    Hypertension    Sleep apnea    no cpap had weight loss   Wears glasses     Past Surgical History:  Procedure Laterality Date   ADENOIDECTOMY     APPENDECTOMY     COLONOSCOPY WITH PROPOFOL N/A 11/30/2016   Procedure: COLONOSCOPY WITH PROPOFOL;  Surgeon: Manya Silvas, MD;  Location: [facility] ENDOSCOPY;  Service: Endoscopy;  Laterality: N/A;   COLONOSCOPY WITH PROPOFOL N/A 12/19/2021   Procedure: COLONOSCOPY WITH PROPOFOL;  Surgeon: [name], MD;  Location: [facility] ENDOSCOPY;  Service: Gastroenterology;  Laterality: N/A;   CORONARY ARTERY BYPASS GRAFT N/A 06/23/2017   Procedure: CORONARY ARTERY BYPASS GRAFTING (CABG) x 4, on pump,LIMA to LAD, SVG to OM, SVG to DIAGONAL, SVG to DISTAL RCA, using left internal mammary artery and right greater saphenous vein harvested endoscopically;  Surgeon: Grace Isaac, MD;  Location: [place];  Service: Open Heart Surgery;  Laterality: N/A;   LEFT HEART CATH AND CORONARY ANGIOGRAPHY Left 06/18/2017   Procedure: LEFT HEART CATH AND CORONARY ANGIOGRAPHY;  Surgeon: Teodoro Spray, MD;  Location: [place] CV LAB;  Service: Cardiovascular;  Laterality: Left;   SHOULDER SURGERY Right    approx 8-10 years ago   TEE WITHOUT  CARDIOVERSION N/A 06/23/2017   Procedure: TRANSESOPHAGEAL ECHOCARDIOGRAM (TEE);  Surgeon: Grace Isaac, MD;  Location: [place];  Service: Open Heart Surgery;  Laterality: N/A;   TONSILLECTOMY      MEDICATIONS:  aspirin 81 MG EC tablet   ibuprofen (ADVIL) 200 MG tablet   metFORMIN (GLUCOPHAGE) 500 MG tablet   Multiple Vitamins-Minerals (MULTIVITAMIN WITH MINERALS) tablet   ramipril (ALTACE) 5 MG capsule   Semaglutide,0.25 or 0.'5MG'$ /DOS, (OZEMPIC, 0.25 OR 0.5 MG/DOSE,) 2 MG/1.5ML SOPN   sildenafil (VIAGRA) 100 MG tablet   No current facility-administered medications for this encounter.    Konrad Felix Ward, PA-C [facility]-Surgical Testing [phone]

## 2022-04-30 ENCOUNTER — Ambulatory Visit (HOSPITAL_COMMUNITY)
Admission: RE | Admit: 2022-04-30 | Discharge: 2022-04-30 | Disposition: A | Discharge status: Home / Self Care | Payer: PPO | Attending: General Surgery | Admitting: General Surgery

## 2022-04-30 ENCOUNTER — Encounter (HOSPITAL_COMMUNITY): Payer: Self-pay | Admitting: General Surgery

## 2022-04-30 ENCOUNTER — Other Ambulatory Visit: Payer: Self-pay

## 2022-04-30 ENCOUNTER — Encounter (HOSPITAL_COMMUNITY)
Admission: RE | Disposition: A | Discharge status: Home / Self Care | Payer: Self-pay | Source: Home / Self Care | Attending: General Surgery

## 2022-04-30 ENCOUNTER — Ambulatory Visit (HOSPITAL_BASED_OUTPATIENT_CLINIC_OR_DEPARTMENT_OTHER): Payer: PPO | Admitting: Certified Registered Nurse Anesthetist

## 2022-04-30 ENCOUNTER — Ambulatory Visit (HOSPITAL_COMMUNITY): Payer: PPO | Admitting: Physician Assistant

## 2022-04-30 DIAGNOSIS — Z7984 Long term (current) use of oral hypoglycemic drugs: Secondary | ICD-10-CM | POA: Diagnosis not present

## 2022-04-30 DIAGNOSIS — E119 Type 2 diabetes mellitus without complications: Secondary | ICD-10-CM | POA: Diagnosis not present

## 2022-04-30 DIAGNOSIS — Z87891 Personal history of nicotine dependence: Secondary | ICD-10-CM | POA: Diagnosis not present

## 2022-04-30 DIAGNOSIS — K4090 Unilateral inguinal hernia, without obstruction or gangrene, not specified as recurrent: Secondary | ICD-10-CM

## 2022-04-30 DIAGNOSIS — I25810 Atherosclerosis of coronary artery bypass graft(s) without angina pectoris: Secondary | ICD-10-CM | POA: Diagnosis not present

## 2022-04-30 DIAGNOSIS — E785 Hyperlipidemia, unspecified: Secondary | ICD-10-CM | POA: Diagnosis not present

## 2022-04-30 DIAGNOSIS — Z7985 Long-term (current) use of injectable non-insulin antidiabetic drugs: Secondary | ICD-10-CM | POA: Diagnosis not present

## 2022-04-30 DIAGNOSIS — K429 Umbilical hernia without obstruction or gangrene: Secondary | ICD-10-CM | POA: Diagnosis not present

## 2022-04-30 DIAGNOSIS — K760 Fatty (change of) liver, not elsewhere classified: Secondary | ICD-10-CM | POA: Insufficient documentation

## 2022-04-30 DIAGNOSIS — I110 Hypertensive heart disease with heart failure: Secondary | ICD-10-CM | POA: Insufficient documentation

## 2022-04-30 DIAGNOSIS — Z79899 Other long term (current) drug therapy: Secondary | ICD-10-CM | POA: Diagnosis not present

## 2022-04-30 DIAGNOSIS — G4730 Sleep apnea, unspecified: Secondary | ICD-10-CM | POA: Insufficient documentation

## 2022-04-30 DIAGNOSIS — Z7982 Long term (current) use of aspirin: Secondary | ICD-10-CM | POA: Diagnosis not present

## 2022-04-30 DIAGNOSIS — I700 Atherosclerosis of aorta: Secondary | ICD-10-CM | POA: Diagnosis not present

## 2022-04-30 DIAGNOSIS — I509 Heart failure, unspecified: Secondary | ICD-10-CM | POA: Insufficient documentation

## 2022-04-30 DIAGNOSIS — G8918 Other acute postprocedural pain: Secondary | ICD-10-CM | POA: Diagnosis not present

## 2022-04-30 HISTORY — PX: INGUINAL HERNIA REPAIR: SHX194

## 2022-04-30 HISTORY — PX: UMBILICAL HERNIA REPAIR: SHX196

## 2022-04-30 LAB — GLUCOSE, CAPILLARY
Glucose-Capillary: 132 mg/dL — ABNORMAL HIGH (ref 70–99)
Glucose-Capillary: 119 mg/dL — ABNORMAL HIGH (ref 70–99)

## 2022-04-30 SURGERY — REPAIR, HERNIA, INGUINAL, ADULT
Anesthesia: General | Laterality: Right

## 2022-04-30 MED ORDER — LIDOCAINE 2% (20 MG/ML) 5 ML SYRINGE
INTRAMUSCULAR | Status: DC | PRN
  Administered 2022-04-30: 100 mg via INTRAVENOUS

## 2022-04-30 MED ORDER — FENTANYL CITRATE PF 50 MCG/ML IJ SOSY
PREFILLED_SYRINGE | INTRAMUSCULAR | Status: AC
  Administered 2022-04-30: 50 ug via INTRAVENOUS
  Filled 2022-04-30: qty 1

## 2022-04-30 MED ORDER — FENTANYL CITRATE PF 50 MCG/ML IJ SOSY
25.0000 ug | PREFILLED_SYRINGE | INTRAMUSCULAR | Status: DC | PRN
  Administered 2022-04-30 (×2): 50 ug via INTRAVENOUS

## 2022-04-30 MED ORDER — CHLORHEXIDINE GLUCONATE 0.12 % MT SOLN
15.0000 mL | Freq: Once | OROMUCOSAL | Status: AC
  Administered 2022-04-30: 15 mL via OROMUCOSAL

## 2022-04-30 MED ORDER — FENTANYL CITRATE (PF) 250 MCG/5ML IJ SOLN
INTRAMUSCULAR | Status: DC | PRN
  Administered 2022-04-30 (×2): 50 ug via INTRAVENOUS
  Administered 2022-04-30: 100 ug via INTRAVENOUS

## 2022-04-30 MED ORDER — BUPIVACAINE LIPOSOME 1.3 % IJ SUSP
INTRAMUSCULAR | Status: AC
  Filled 2022-04-30: qty 20

## 2022-04-30 MED ORDER — BUPIVACAINE HCL 0.5 % IJ SOLN
INTRAMUSCULAR | Status: DC | PRN
  Administered 2022-04-30: 10 mL

## 2022-04-30 MED ORDER — CEFAZOLIN SODIUM-DEXTROSE 2-4 GM/100ML-% IV SOLN
2.0000 g | INTRAVENOUS | Status: AC
  Administered 2022-04-30: 2 g via INTRAVENOUS
  Filled 2022-04-30: qty 100

## 2022-04-30 MED ORDER — KETOROLAC TROMETHAMINE 30 MG/ML IJ SOLN
INTRAMUSCULAR | Status: AC
  Administered 2022-04-30: 30 mg via INTRAVENOUS
  Filled 2022-04-30: qty 1

## 2022-04-30 MED ORDER — MIDAZOLAM HCL 2 MG/2ML IJ SOLN
2.0000 mg | INTRAMUSCULAR | Status: DC
  Administered 2022-04-30: 1 mg via INTRAVENOUS
  Filled 2022-04-30: qty 2

## 2022-04-30 MED ORDER — LACTATED RINGERS IV SOLN: INTRAVENOUS | Status: DC

## 2022-04-30 MED ORDER — ONDANSETRON HCL 4 MG/2ML IJ SOLN
INTRAMUSCULAR | Status: DC | PRN
  Administered 2022-04-30: 4 mg via INTRAVENOUS

## 2022-04-30 MED ORDER — ROCURONIUM BROMIDE 10 MG/ML (PF) SYRINGE
PREFILLED_SYRINGE | INTRAVENOUS | Status: DC | PRN
  Administered 2022-04-30: 60 mg via INTRAVENOUS

## 2022-04-30 MED ORDER — PHENYLEPHRINE HCL-NACL 20-0.9 MG/250ML-% IV SOLN
INTRAVENOUS | Status: DC | PRN
  Administered 2022-04-30: 35 ug/min via INTRAVENOUS

## 2022-04-30 MED ORDER — CHLORHEXIDINE GLUCONATE CLOTH 2 % EX PADS: 6.0000 | MEDICATED_PAD | Freq: Once | CUTANEOUS | Status: DC

## 2022-04-30 MED ORDER — ENSURE PRE-SURGERY PO LIQD: 296.0000 mL | Freq: Once | ORAL | Status: DC

## 2022-04-30 MED ORDER — ACETAMINOPHEN 500 MG PO TABS
1000.0000 mg | ORAL_TABLET | ORAL | Status: AC
  Administered 2022-04-30: 1000 mg via ORAL
  Filled 2022-04-30: qty 2

## 2022-04-30 MED ORDER — ONDANSETRON HCL 4 MG/2ML IJ SOLN
INTRAMUSCULAR | Status: AC
  Filled 2022-04-30: qty 2

## 2022-04-30 MED ORDER — OXYCODONE HCL 5 MG PO TABS
ORAL_TABLET | ORAL | Status: AC
  Filled 2022-04-30: qty 1

## 2022-04-30 MED ORDER — FENTANYL CITRATE PF 50 MCG/ML IJ SOSY
PREFILLED_SYRINGE | INTRAMUSCULAR | Status: AC
  Administered 2022-04-30: 50 ug via INTRAVENOUS
  Filled 2022-04-30: qty 2

## 2022-04-30 MED ORDER — OXYCODONE HCL 5 MG PO TABS
5.0000 mg | ORAL_TABLET | ORAL | Status: AC | PRN
  Administered 2022-04-30: 5 mg via ORAL

## 2022-04-30 MED ORDER — KETAMINE HCL 10 MG/ML IJ SOLN
INTRAMUSCULAR | Status: DC | PRN
  Administered 2022-04-30: 20 mg via INTRAVENOUS
  Administered 2022-04-30: 30 mg via INTRAVENOUS

## 2022-04-30 MED ORDER — 0.9 % SODIUM CHLORIDE (POUR BTL) OPTIME
TOPICAL | Status: DC | PRN
  Administered 2022-04-30: 1000 mL

## 2022-04-30 MED ORDER — KETOROLAC TROMETHAMINE 30 MG/ML IJ SOLN: 30.0000 mg | Freq: Once | INTRAMUSCULAR | Status: AC | PRN

## 2022-04-30 MED ORDER — BUPIVACAINE HCL 0.25 % IJ SOLN
INTRAMUSCULAR | Status: AC
  Filled 2022-04-30: qty 1

## 2022-04-30 MED ORDER — DEXAMETHASONE SODIUM PHOSPHATE 10 MG/ML IJ SOLN
INTRAMUSCULAR | Status: DC | PRN
  Administered 2022-04-30: 8 mg via INTRAVENOUS

## 2022-04-30 MED ORDER — SUGAMMADEX SODIUM 200 MG/2ML IV SOLN
INTRAVENOUS | Status: DC | PRN
  Administered 2022-04-30: 200 mg via INTRAVENOUS

## 2022-04-30 MED ORDER — KETAMINE HCL 50 MG/5ML IJ SOSY
PREFILLED_SYRINGE | INTRAMUSCULAR | Status: AC
  Filled 2022-04-30: qty 5

## 2022-04-30 MED ORDER — PROPOFOL 10 MG/ML IV BOLUS
INTRAVENOUS | Status: AC
  Filled 2022-04-30: qty 20

## 2022-04-30 MED ORDER — EPHEDRINE SULFATE-NACL 50-0.9 MG/10ML-% IV SOSY
PREFILLED_SYRINGE | INTRAVENOUS | Status: DC | PRN
  Administered 2022-04-30: 5 mg via INTRAVENOUS
  Administered 2022-04-30: 10 mg via INTRAVENOUS

## 2022-04-30 MED ORDER — OXYCODONE HCL 5 MG PO TABS
ORAL_TABLET | ORAL | Status: AC
  Administered 2022-04-30: 5 mg via ORAL
  Filled 2022-04-30: qty 1

## 2022-04-30 MED ORDER — PHENYLEPHRINE 80 MCG/ML (10ML) SYRINGE FOR IV PUSH (FOR BLOOD PRESSURE SUPPORT)
PREFILLED_SYRINGE | INTRAVENOUS | Status: DC | PRN
  Administered 2022-04-30: 160 ug via INTRAVENOUS

## 2022-04-30 MED ORDER — ONDANSETRON HCL 4 MG/2ML IJ SOLN: 4.0000 mg | Freq: Once | INTRAMUSCULAR | Status: DC | PRN

## 2022-04-30 MED ORDER — ORAL CARE MOUTH RINSE: 15.0000 mL | Freq: Once | OROMUCOSAL | Status: AC

## 2022-04-30 MED ORDER — IBUPROFEN 800 MG PO TABS: 800.0000 mg | ORAL_TABLET | Freq: Three times a day (TID) | ORAL | 0 refills | Status: AC | PRN

## 2022-04-30 MED ORDER — MIDAZOLAM HCL 2 MG/2ML IJ SOLN
INTRAMUSCULAR | Status: AC
  Filled 2022-04-30: qty 2

## 2022-04-30 MED ORDER — OXYCODONE HCL 5 MG PO TABS: 5.0000 mg | ORAL_TABLET | Freq: Once | ORAL | Status: AC | PRN

## 2022-04-30 MED ORDER — DEXAMETHASONE SODIUM PHOSPHATE 10 MG/ML IJ SOLN
INTRAMUSCULAR | Status: AC
  Filled 2022-04-30: qty 1

## 2022-04-30 MED ORDER — FENTANYL CITRATE PF 50 MCG/ML IJ SOSY
100.0000 ug | PREFILLED_SYRINGE | INTRAMUSCULAR | Status: DC
  Administered 2022-04-30: 50 ug via INTRAVENOUS
  Filled 2022-04-30: qty 2

## 2022-04-30 MED ORDER — LIDOCAINE HCL (PF) 2 % IJ SOLN
INTRAMUSCULAR | Status: AC
  Filled 2022-04-30: qty 5

## 2022-04-30 MED ORDER — PROPOFOL 10 MG/ML IV BOLUS
INTRAVENOUS | Status: DC | PRN
  Administered 2022-04-30: 200 mg via INTRAVENOUS

## 2022-04-30 MED ORDER — ROPIVACAINE HCL 5 MG/ML IJ SOLN
INTRAMUSCULAR | Status: DC | PRN
  Administered 2022-04-30: 30 mL

## 2022-04-30 MED ORDER — FENTANYL CITRATE (PF) 250 MCG/5ML IJ SOLN
INTRAMUSCULAR | Status: AC
  Filled 2022-04-30: qty 5

## 2022-04-30 MED ORDER — OXYCODONE HCL 5 MG/5ML PO SOLN: 5.0000 mg | Freq: Once | ORAL | Status: AC | PRN

## 2022-04-30 MED ORDER — ROCURONIUM BROMIDE 10 MG/ML (PF) SYRINGE
PREFILLED_SYRINGE | INTRAVENOUS | Status: AC
  Filled 2022-04-30: qty 10

## 2022-04-30 SURGICAL SUPPLY — 46 items
BAG COUNTER SPONGE SURGICOUNT (BAG) ×2 IMPLANT
BENZOIN TINCTURE PRP APPL 2/3 (GAUZE/BANDAGES/DRESSINGS) IMPLANT
BINDER ABDOMINAL 12 ML 46-62 (SOFTGOODS) IMPLANT
BLADE SURG 15 STRL LF DISP TIS (BLADE) ×2 IMPLANT
BLADE SURG 15 STRL SS (BLADE) ×2
BLADE SURG SZ10 CARB STEEL (BLADE) ×2 IMPLANT
CHLORAPREP W/TINT 26 (MISCELLANEOUS) ×2 IMPLANT
COVER SURGICAL LIGHT HANDLE (MISCELLANEOUS) ×2 IMPLANT
DERMABOND ADVANCED .7 DNX12 (GAUZE/BANDAGES/DRESSINGS) ×2 IMPLANT
DRAIN PENROSE 0.5X18 (DRAIN) IMPLANT
DRAPE LAPAROSCOPIC ABDOMINAL (DRAPES) ×2 IMPLANT
DRAPE LAPAROTOMY TRNSV 102X78 (DRAPES) ×2 IMPLANT
DRAPE UTILITY XL STRL (DRAPES) ×2 IMPLANT
DRSG TELFA PLUS 4X6 ADH ISLAND (GAUZE/BANDAGES/DRESSINGS) IMPLANT
ELECT REM PT RETURN 15FT ADLT (MISCELLANEOUS) ×2 IMPLANT
GAUZE SPONGE 4X4 12PLY STRL (GAUZE/BANDAGES/DRESSINGS) IMPLANT
GLOVE BIOGEL PI IND STRL 7.0 (GLOVE) ×2 IMPLANT
GLOVE SURG SS PI 7.0 STRL IVOR (GLOVE) ×2 IMPLANT
GOWN STRL REUS W/ TWL LRG LVL3 (GOWN DISPOSABLE) ×2 IMPLANT
GOWN STRL REUS W/ TWL XL LVL3 (GOWN DISPOSABLE) IMPLANT
GOWN STRL REUS W/TWL LRG LVL3 (GOWN DISPOSABLE) ×2
GOWN STRL REUS W/TWL XL LVL3 (GOWN DISPOSABLE)
KIT BASIN OR (CUSTOM PROCEDURE TRAY) ×4 IMPLANT
KIT TURNOVER KIT A (KITS) IMPLANT
MARKER SKIN DUAL TIP RULER LAB (MISCELLANEOUS) ×2 IMPLANT
MESH HERNIA 3X6 (Mesh General) IMPLANT
NEEDLE HYPO 22GX1.5 SAFETY (NEEDLE) ×2 IMPLANT
PACK BASIC VI WITH GOWN DISP (CUSTOM PROCEDURE TRAY) ×2 IMPLANT
PENCIL SMOKE EVACUATOR (MISCELLANEOUS) ×2 IMPLANT
SPIKE FLUID TRANSFER (MISCELLANEOUS) ×2 IMPLANT
SPONGE T-LAP 4X18 ~~LOC~~+RFID (SPONGE) ×2 IMPLANT
STRIP CLOSURE SKIN 1/2X4 (GAUZE/BANDAGES/DRESSINGS) IMPLANT
SUT MNCRL AB 4-0 PS2 18 (SUTURE) ×2 IMPLANT
SUT NOVA NAB GS-21 0 18 T12 DT (SUTURE) IMPLANT
SUT PDS AB 0 CT1 36 (SUTURE) IMPLANT
SUT PDS AB 2-0 CT2 27 (SUTURE) ×2 IMPLANT
SUT PROLENE 2 0 CT2 30 (SUTURE) ×4 IMPLANT
SUT VIC AB 3-0 SH 18 (SUTURE) ×2 IMPLANT
SUT VIC AB 3-0 SH 27 (SUTURE) ×2
SUT VIC AB 3-0 SH 27X BRD (SUTURE) ×2 IMPLANT
SUT VIC AB 3-0 SH 27XBRD (SUTURE) IMPLANT
SYR BULB IRRIG 60ML STRL (SYRINGE) ×2 IMPLANT
SYR CONTROL 10ML LL (SYRINGE) ×2 IMPLANT
TOWEL OR 17X26 10 PK STRL BLUE (TOWEL DISPOSABLE) ×2 IMPLANT
TOWEL OR NON WOVEN STRL DISP B (DISPOSABLE) ×2 IMPLANT
YANKAUER SUCT BULB TIP 10FT TU (MISCELLANEOUS) ×2 IMPLANT

## 2022-04-30 NOTE — Anesthesia Procedure Notes (Signed)
Anesthesia Procedure Image       

## 2022-04-30 NOTE — Anesthesia Procedure Notes (Signed)
Anesthesia Regional Block: TAP block   Pre-Anesthetic Checklist: , timeout performed,  Correct Patient, Correct Site, Correct Laterality,  Correct Procedure, Correct Position, site marked,  Risks and benefits discussed,  Surgical consent,  Pre-op evaluation,  At surgeon's request and post-op pain management  Laterality: Right  Prep: chloraprep       Needles:  Injection technique: Single-shot  Needle Type: Echogenic Needle     Needle Length: 9cm      Additional Needles:   Procedures:,,,, ultrasound used (permanent image in chart),,    Narrative:  Start time: 04/30/2022 10:23 AM End time: 04/30/2022 10:32 AM Injection made incrementally with aspirations every 5 mL.  Performed by: Personally  Anesthesiologist: Myrtie Soman, MD  Additional Notes: Patient tolerated the procedure well without complications

## 2022-04-30 NOTE — Transfer of Care (Signed)
Immediate Anesthesia Transfer of Care Note  Patient: Phillip Le  Procedure(s) Performed: RIGHT OPEN INGUINAL HERNIA REPAIR WITH MESH (Right) OPEN UMBILICAL HERNIA REPAIR  Patient Location: PACU  Anesthesia Type:General  Level of Consciousness: awake, alert , and oriented  Airway & Oxygen Therapy: Patient Spontanous Breathing and Patient connected to face mask oxygen  Post-op Assessment: Report given to RN and Post -op Vital signs reviewed and stable  Post vital signs: Reviewed and stable  Last Vitals:  Vitals Value Taken Time  BP 145/102 04/30/22 1308  Temp    Pulse 78 04/30/22 1310  Resp    SpO2 100 % 04/30/22 1310  Vitals shown include unvalidated device data.  Last Pain:  Vitals:   04/30/22 1039  TempSrc:   PainSc: 0-No pain         Complications: No notable events documented.

## 2022-04-30 NOTE — Op Note (Signed)
Preop diagnosis: right inguinal hernia, umbilical hernia  Postop diagnosis: right inguinal hernia, umbilical hernia  Procedure: open Right inguinal hernia repair with mesh, 1 cm open umbilical hernia repair  Surgeon: Gurney Maxin, M.D.  Asst: none  Anesthesia: Gen.   Indications for procedure: Phillip Le is a 72 y.o. male with symptoms of pain and enlarging Right inguinal hernia(s). After discussing risks, alternatives and benefits he decided on open repair and was brought to day surgery for repair.  Description of procedure: The patient was brought into the operative suite, placed supine. Anesthesia was administered with endotracheal tube. Patient was strapped in place. The patient was prepped and draped in the usual sterile fashion.  The anterior superior iliac spine and pubic tubercle were identified on the Right side. An incision was made 1cm above the connecting line, representative of the location of the inguinal ligament. The subcutaneous tissue was bluntly dissected, scarpa's fascia was dissected away. The external abdominal oblique fascia was identified and sharply opened down to the external inguinal ring. The conjoint tendon and inguinal ligament were identified. The cord structures and sac were dissected free of the surrounding tissue in 360 degrees. A penrose drain was used to encircle the contents. The cremasteric fibers were dissected free of the contents of the cord and hernia sac. The cord structures (vessels and vas deferens) were identified and carefully dissected away from the hernia sac. The hernia sac was reduced and contained no visceral structures.The hernia sac was dissected down to the internal inguinal ring. Preperitoneal fat was identified showing appropriate dissection. The sac was then reduced into the preperitoneal space. The hernia was indirect. A 3x6 Bard mesh was then used to close the defect and reinforce the floor. The mesh was sutured to the lacunar  ligament and inguinal ligament using a 2-0 prolene in running fashion. Next the superior edge of the mesh was sutured to the conjoined tendon using a 2-0 running Prolene. An additional 2-0 Prolene was used to suture the tail ends of the mesh together re-creating the deep ring. Cord structures are running in a neutral position through the mesh. Next the external abdominal oblique fascia was closed with a 2-0 Vicryl in interrupted fashion to re-create the external inguinal ring. Scarpa's fascia was closed with 3-0 Vicryl in running fashion.    Next, an infraumbilical incision was made. Cautery and blunt dissection were used to dissect down to the fascia and the umbilical stalk was dissected free in 360 degrees. The skin was separated from the umbilical hernia. The hernia was reduced. The defect was 1 cm. It was closed with interrupted 0 PDS. The base of the umbilical skin was sutured to the fascia.   Skin was closed with a 4-0 Monocryl subcuticular stitch in running fashion. Dermabond place for dressing. Patient woke from anesthesia and brought to PACU in stable condition. All counts are correct.    Findings: indirect inguinal hernia, 1 cm reduced umbilical hernia  Specimen: none  Blood loss: 10 ml  Local anesthesia: 10 ml Marcaine  Complications: none  Implant: Bard Mesh  Gurney Maxin, M.D. General, Bariatric, & Minimally Invasive Surgery [facility], [place] 12:57 PM 04/30/2022

## 2022-04-30 NOTE — H&P (Signed)
Chief Complaint: Inguinal Hernia  History of Present Illness: Phillip Le is a 72 y.o. male who is seen today as an office consultation at the request of Dr. Tresa Moore for evaluation of Inguinal Hernia .   He first noticed the hernia 3 months ago. Symptoms are soreness when out. He will come out with coughing or lifting. He is frequently manually reducing it. He denies nausea or vomiting or bowel habit change.  He does not smoke He has diabetes He has no history of hernias  Review of Systems: A complete review of systems was obtained from the patient. I have reviewed this information and discussed as appropriate with the patient. See HPI as well for other ROS.  Review of Systems  Constitutional: Negative.  HENT: Negative.  Eyes: Negative.  Respiratory: Negative.  Cardiovascular: Negative.  Gastrointestinal: Positive for abdominal pain.  Genitourinary: Negative.  Musculoskeletal: Negative.  Skin: Negative.  Neurological: Negative.  Endo/Heme/Allergies: Negative.  Psychiatric/Behavioral: Negative.   Medical History: Past Medical History:  Diagnosis Date  Aortic atherosclerosis (CMS-HCC)  on CT scan, 2019  Arthritis  ASCVD (arteriosclerotic cardiovascular disease)  CHF (congestive heart failure) (CMS-HCC)  Elevated transaminase level  and fatty liver disease  Hyperlipidemia  Hypertension  Polycythemia  Sleep apnea  Type 2 diabetes mellitus (CMS-HCC)  Vertigo   Patient Active Problem List  Diagnosis  Hyperlipidemia  Sleep apnea  Essential hypertension  Type II diabetes mellitus (CMS-HCC)  Intermittent palpitations  3-vessel CAD  Ventral hernia without obstruction or gangrene  Aortic atherosclerosis (CMS-HCC)   Past Surgical History:  Procedure Laterality Date  shoulder surgery Right 2001  EGD 02/21/2007  COLONOSCOPY 11/30/2016  Adenomatous Polyp: CBF 11/2021  CORONARY ARTERY BYPASS GRAFT 2019  COLONOSCOPY 12/19/2021  Repeat 3 years  APPENDECTOMY   COLONOSCOPY 04/05/2007, 06/29/1997  Hyperplastic Polyp: CBF 03/2017  HEMORROIDECTOMY   No Known Allergies Current Outpatient Medications on File Prior to Visit  Medication Sig Dispense Refill  aspirin 81 MG EC tablet Take 81 mg by mouth once daily  blood glucose diagnostic (ONETOUCH ULTRA TEST) test strip Use 2 (two) times daily Use as instructed. 270 each 3  blood glucose diagnostic (ONETOUCH ULTRA TEST) test strip 1 each (1 strip total) 3 (three) times daily Use as instructed. (Patient not taking: Reported on 09/22/2021) 100 each 12  blood-glucose meter (ONETOUCH ULTRA2 METER) Misc 1 each as directed 1 each 0  lancets 30 gauge Misc Use 200 each 2 (two) times daily 200 each 0  MEN'S MULTI-VITAMIN ORAL Take by mouth once daily  metFORMIN (GLUCOPHAGE) 500 MG tablet Take 1 tablet (500 mg total) by mouth 2 (two) times daily with meals 180 tablet 3  OZEMPIC 0.25 mg or 0.5 mg(2 mg/1.5 mL) pen injector Inject 0.'5mg'$  subcutaneously once a week as directed (Patient not taking: Reported on 04/15/2022) 4.5 mL 0  ramipriL (ALTACE) 5 MG capsule Take 1 capsule (5 mg total) by mouth 2 (two) times daily 180 capsule 3  sildenafiL (VIAGRA) 100 MG tablet TAKE 1 TABLET BY MOUTH 1 HOUR BEFORE SEXUAL STIMULATION   No current facility-administered medications on file prior to visit.   Family History  Problem Relation Age of Onset  Coronary Artery Disease (Blocked arteries around heart) Mother  Myocardial Infarction (Heart attack) Mother  Skin cancer Father  Throat cancer Father   Social History   Tobacco Use  Smoking Status Former  Smokeless Tobacco Never   Social History   Socioeconomic History  Marital status: Married  Tobacco Use  Smoking status: Former  Smokeless tobacco: Never  Substance and Sexual Activity  Alcohol use: No  Drug use: No  Sexual activity: Defer   Objective:   Vitals:  04/15/22 0919  BP: 122/80  Pulse: 76  Temp: 36.7 C (98.1 F)  SpO2: 97%  Weight: 79.5 kg (175 lb  3.2 oz)  Height: 174 cm (5' 8.5")  PainSc: 4   Body mass index is 26.25 kg/m.  Physical Exam Constitutional:  Appearance: Normal appearance.  HENT:  Head: Normocephalic and atraumatic.  Pulmonary:  Effort: Pulmonary effort is normal.  Abdominal:  Comments: Small umbilical hernia, moderate right inguinal hernia  Musculoskeletal:  General: Normal range of motion.  Cervical back: Normal range of motion.  Neurological:  General: No focal deficit present.  Mental Status: He is alert and oriented to person, place, and time. Mental status is at baseline.  Psychiatric:  Mood and Affect: Mood normal.  Behavior: Behavior normal.  Thought Content: Thought content normal.     Labs, Imaging and Diagnostic Testing: I reviewed notes by Alexis Frock  Assessment and Plan:  Diagnoses and all orders for this visit:  Umbilical hernia without obstruction or gangrene  Non-recurrent unilateral inguinal hernia without obstruction or gangrene  Coronary artery disease involving autologous artery coronary bypass graft without angina pectoris    We discussed etiology of hernias and how they can cause pain. We discussed options for inguinal hernia repair vs observation. We discussed details of the surgery of general anesthesia, surgical approach and incisions, dissecting the sack away from vas deference, testicular vessels and nerves and placement of mesh. We discussed risks of bleeding, infection, recurrence, injury to vas deference, testicular vessels, nerve injury, and chronic pain. He showed good understanding and wanted proceed with open right inguinal hernia repair as outpatient. We will also repair his umbilical hernia primarily at same time.

## 2022-04-30 NOTE — Anesthesia Procedure Notes (Signed)
Procedure Name: Intubation Date/Time: 04/30/2022 11:41 AM  Performed by: Maxwell Caul, CRNAPre-anesthesia Checklist: Patient identified, Emergency Drugs available, Suction available and Patient being monitored Patient Re-evaluated:Patient Re-evaluated prior to induction Oxygen Delivery Method: Circle system utilized Preoxygenation: Pre-oxygenation with 100% oxygen Induction Type: IV induction Ventilation: Mask ventilation without difficulty Laryngoscope Size: Mac and 4 Grade View: Grade I Tube type: Oral Tube size: 7.5 mm Number of attempts: 1 Airway Equipment and Method: Stylet Placement Confirmation: ETT inserted through vocal cords under direct vision, positive ETCO2 and breath sounds checked- equal and bilateral Secured at: 22 cm Tube secured with: Tape Dental Injury: Teeth and Oropharynx as per pre-operative assessment

## 2022-04-30 NOTE — Discharge Instructions (Signed)
[facility]__[facility]  UMBILICAL OR INGUINAL HERNIA REPAIR: POST OP INSTRUCTIONS  Always review your discharge instruction sheet given to you by the facility where your surgery was performed. IF YOU HAVE DISABILITY OR FAMILY LEAVE FORMS, YOU MUST BRING THEM TO THE OFFICE FOR PROCESSING.   DO NOT GIVE THEM TO YOUR DOCTOR.  1. A  prescription for pain medication may be given to you upon discharge.  Take your pain medication as prescribed, if needed.  If narcotic pain medicine is not needed, then you may take acetaminophen (Tylenol) or ibuprofen (Advil) as needed. 2. Take your usually prescribed medications unless otherwise directed. If you need a refill on your pain medication, please contact your pharmacy.  They will contact our office to request authorization. Prescriptions will not be filled after 5 pm or on week-ends. 3. You should follow a light diet the first 24 hours after arrival home, such as soup and crackers, etc.  Be sure to include lots of fluids daily.  Resume your normal diet the day after surgery. 4.Most patients will experience some swelling and bruising around the umbilicus or in the groin and scrotum.  Ice packs and reclining will help.  Swelling and bruising can take several days to resolve.  6. It is common to experience some constipation if taking pain medication after surgery.  Increasing fluid intake and taking a stool softener (such as Colace) will usually help or prevent this problem from occurring.  A mild laxative (Milk of Magnesia or Miralax) should be taken according to package directions if there are no bowel movements after 48 hours. 7. Unless discharge instructions indicate otherwise, you may remove your bandages 24-48 hours after surgery, and you may shower at that time.  You may have steri-strips (small skin tapes) in place directly over the incision.  These strips should be left on the skin for 7-10 days.  If your surgeon used skin glue on the  incision, you may shower in 24 hours.  The glue will flake off over the next 2-3 weeks.  Any sutures or staples will be removed at the office during your follow-up visit. 8. ACTIVITIES:  You may resume regular (light) daily activities beginning the next day--such as daily self-care, walking, climbing stairs--gradually increasing activities as tolerated.  You may have sexual intercourse when it is comfortable.  Refrain from any heavy lifting or straining until approved by your doctor.  a.You may drive when you are no longer taking prescription pain medication, you can comfortably wear a seatbelt, and you can safely maneuver your car and apply brakes. b.RETURN TO WORK:   _____________________________________________  9.You should see your doctor in the office for a follow-up appointment approximately 2-3 weeks after your surgery.  Make sure that you call for this appointment within a day or two after you arrive home to insure a convenient appointment time. 10.OTHER INSTRUCTIONS: _________________________    _____________________________________  WHEN TO CALL YOUR DOCTOR: Fever over 101.0 Inability to urinate Nausea and/or vomiting Extreme swelling or bruising Continued bleeding from incision. Increased pain, redness, or drainage from the incision  The clinic staff is available to answer your questions during regular business hours.  Please don't hesitate to call and ask to speak to one of the nurses for clinical concerns.  If you have a medical emergency, go to the nearest emergency room or call 911.  A surgeon from [facility] is always on call at the hospital   [address], [address],   [place], [place]  [address] ?  [address] [address], [place], [place]   [address] [phone] ? [phone] ? FAX [phone] Web site: [email or web address]  

## 2022-04-30 NOTE — Anesthesia Preprocedure Evaluation (Signed)
Anesthesia Evaluation  Patient identified by MRN, date of birth, ID band Patient awake    Reviewed: Allergy & Precautions, H&P , NPO status , Patient's Chart, lab work & pertinent test results  Airway Mallampati: II  TM Distance: >3 FB Neck ROM: Full    Dental no notable dental hx.    Pulmonary neg pulmonary ROS, former smoker   Pulmonary exam normal breath sounds clear to auscultation       Cardiovascular hypertension, + CAD and + CABG   Rhythm:Regular Rate:Normal + Systolic murmurs    Neuro/Psych negative neurological ROS  negative psych ROS   GI/Hepatic negative GI ROS, Neg liver ROS,,,  Endo/Other  diabetes    Renal/GU negative Renal ROS  negative genitourinary   Musculoskeletal negative musculoskeletal ROS (+)    Abdominal   Peds negative pediatric ROS (+)  Hematology negative hematology ROS (+)   Anesthesia Other Findings   Reproductive/Obstetrics negative OB ROS                             Anesthesia Physical Anesthesia Plan  ASA: 2  Anesthesia Plan: General   Post-op Pain Management: Tylenol PO (pre-op)*   Induction: Intravenous  PONV Risk Score and Plan: 2 and Ondansetron, Dexamethasone and Treatment may vary due to age or medical condition  Airway Management Planned: Oral ETT  Additional Equipment:   Intra-op Plan:   Post-operative Plan: Extubation in OR  Informed Consent: I have reviewed the patients History and Physical, chart, labs and discussed the procedure including the risks, benefits and alternatives for the proposed anesthesia with the patient or authorized representative who has indicated his/her understanding and acceptance.     Dental advisory given  Plan Discussed with: CRNA and Surgeon  Anesthesia Plan Comments:        Anesthesia Quick Evaluation

## 2022-04-30 NOTE — Anesthesia Postprocedure Evaluation (Signed)
Anesthesia Post Note  Patient: Phillip Le  Procedure(s) Performed: RIGHT OPEN INGUINAL HERNIA REPAIR WITH MESH (Right) OPEN UMBILICAL HERNIA REPAIR     Patient location during evaluation: PACU Anesthesia Type: General Level of consciousness: awake and alert Pain management: pain level controlled Vital Signs Assessment: post-procedure vital signs reviewed and stable Respiratory status: spontaneous breathing, nonlabored ventilation, respiratory function stable and patient connected to nasal cannula oxygen Cardiovascular status: blood pressure returned to baseline and stable Postop Assessment: no apparent nausea or vomiting Anesthetic complications: no  No notable events documented.  Last Vitals:  Vitals:   04/30/22 1426 04/30/22 1430  BP: 134/77 134/77  Pulse:  (!) 56  Resp: 20 20  Temp: 36.6 C   SpO2: 100% 98%    Last Pain:  Vitals:   04/30/22 1430  TempSrc:   PainSc: 0-No pain                 Huntington Leverich S

## 2022-05-01 ENCOUNTER — Encounter (HOSPITAL_COMMUNITY): Payer: Self-pay | Admitting: General Surgery

## 2022-06-10 DIAGNOSIS — D2271 Melanocytic nevi of right lower limb, including hip: Secondary | ICD-10-CM | POA: Diagnosis not present

## 2022-06-10 DIAGNOSIS — L821 Other seborrheic keratosis: Secondary | ICD-10-CM | POA: Diagnosis not present

## 2022-06-10 DIAGNOSIS — D2272 Melanocytic nevi of left lower limb, including hip: Secondary | ICD-10-CM | POA: Diagnosis not present

## 2022-06-10 DIAGNOSIS — D225 Melanocytic nevi of trunk: Secondary | ICD-10-CM | POA: Diagnosis not present

## 2022-06-10 DIAGNOSIS — D2261 Melanocytic nevi of right upper limb, including shoulder: Secondary | ICD-10-CM | POA: Diagnosis not present

## 2022-06-10 DIAGNOSIS — X32XXXA Exposure to sunlight, initial encounter: Secondary | ICD-10-CM | POA: Diagnosis not present

## 2022-06-10 DIAGNOSIS — L570 Actinic keratosis: Secondary | ICD-10-CM | POA: Diagnosis not present

## 2022-06-10 DIAGNOSIS — L603 Nail dystrophy: Secondary | ICD-10-CM | POA: Diagnosis not present

## 2022-06-10 DIAGNOSIS — D2262 Melanocytic nevi of left upper limb, including shoulder: Secondary | ICD-10-CM | POA: Diagnosis not present

## 2022-09-23 DIAGNOSIS — H04123 Dry eye syndrome of bilateral lacrimal glands: Secondary | ICD-10-CM | POA: Diagnosis not present

## 2022-09-23 DIAGNOSIS — E119 Type 2 diabetes mellitus without complications: Secondary | ICD-10-CM | POA: Diagnosis not present

## 2022-09-23 DIAGNOSIS — H2513 Age-related nuclear cataract, bilateral: Secondary | ICD-10-CM | POA: Diagnosis not present

## 2022-09-23 DIAGNOSIS — H40003 Preglaucoma, unspecified, bilateral: Secondary | ICD-10-CM | POA: Diagnosis not present

## 2022-09-28 DIAGNOSIS — Z8679 Personal history of other diseases of the circulatory system: Secondary | ICD-10-CM | POA: Diagnosis not present

## 2022-09-28 DIAGNOSIS — E7800 Pure hypercholesterolemia, unspecified: Secondary | ICD-10-CM | POA: Diagnosis not present

## 2022-09-28 DIAGNOSIS — E119 Type 2 diabetes mellitus without complications: Secondary | ICD-10-CM | POA: Diagnosis not present

## 2022-09-28 DIAGNOSIS — I700 Atherosclerosis of aorta: Secondary | ICD-10-CM | POA: Diagnosis not present

## 2022-09-28 DIAGNOSIS — R002 Palpitations: Secondary | ICD-10-CM | POA: Diagnosis not present

## 2022-09-28 DIAGNOSIS — Z951 Presence of aortocoronary bypass graft: Secondary | ICD-10-CM | POA: Diagnosis not present

## 2022-09-28 DIAGNOSIS — I10 Essential (primary) hypertension: Secondary | ICD-10-CM | POA: Diagnosis not present

## 2022-09-28 DIAGNOSIS — Z794 Long term (current) use of insulin: Secondary | ICD-10-CM | POA: Diagnosis not present

## 2022-09-28 DIAGNOSIS — G4734 Idiopathic sleep related nonobstructive alveolar hypoventilation: Secondary | ICD-10-CM | POA: Diagnosis not present

## 2022-09-30 DIAGNOSIS — H2511 Age-related nuclear cataract, right eye: Secondary | ICD-10-CM | POA: Diagnosis not present

## 2022-09-30 DIAGNOSIS — H2512 Age-related nuclear cataract, left eye: Secondary | ICD-10-CM | POA: Diagnosis not present

## 2022-10-06 ENCOUNTER — Encounter: Payer: Self-pay | Admitting: Ophthalmology

## 2022-10-06 NOTE — Anesthesia Preprocedure Evaluation (Addendum)
Anesthesia Evaluation    Airway Mallampati: II       Dental   Pulmonary former smoker          Cardiovascular hypertension,   March 2019 Left ventricle: The cavity size was normal. Systolic function was    vigorous. The estimated ejection fraction was in the range of 65%    to 70%. Wall motion was normal; there were no regional wall    motion abnormalities. Left ventricular diastolic function was not    evaluated.  - Aortic valve: Sclerosis without stenosis. There was trivial    regurgitation.  - Mitral valve: There was mild regurgitation.  - Right ventricle: The cavity size was normal. Wall thickness was    normal. Systolic function was normal.  - Right atrium: The atrium was normal in size.  - Tricuspid valve: There was mild regurgitation.  - Pulmonary arteries: Systolic pressure was within the normal    range.  - Inferior vena cava: The vessel was normal in size.  - Pericardium, extracardiac: There was no pericardial effusion.   Dist LM lesion is 60% stenosed.  Ost LAD lesion is 75% stenosed.  Ost 1st Diag lesion is 99% stenosed.  Ost Cx lesion is 70% stenosed.  Prox LAD lesion is 70% stenosed.  Mid RCA lesion is 60% stenosed.  The left ventricular systolic function is normal.  LV end diastolic pressure is normal.  There is no aortic valve stenosis.  There is no mitral valve stenosis and no mitral valve prolapse evident.   LM 60% stenosis LAD 75% ostial stenosis, 70% mid D1 99% ostial stenosis LCx 70% ostial Stenosis RCA-60% mid stenosis   Referred for CABG, S/P CABG  06-18-17     Neuro/Psych    GI/Hepatic   Endo/Other  diabetes    Renal/GU      Musculoskeletal   Abdominal   Peds  Hematology   Anesthesia Other Findings Diabetes mellitus without complication Hypertension Allergy  Gout Sleep apnea  Elevated lipids Dysrhythmia  Coronary artery disease, S/P CABG History of kidney  stones  Arthritis Wears glasses  Wears hearing aid in both ears  Idiopathic sleep-related, non-obstructive alveolar hypoventilation History of atrial fibrillation  Reproductive/Obstetrics                              Anesthesia Physical Anesthesia Plan  ASA: 3  Anesthesia Plan:    Post-op Pain Management:    Induction:   PONV Risk Score and Plan:   Airway Management Planned:   Additional Equipment:   Intra-op Plan:   Post-operative Plan:   Informed Consent:   Plan Discussed with:   Anesthesia Plan Comments:          Anesthesia Quick Evaluation

## 2022-10-12 NOTE — Discharge Instructions (Signed)

## 2022-10-13 ENCOUNTER — Encounter: Payer: Self-pay | Admitting: Ophthalmology

## 2022-10-14 ENCOUNTER — Ambulatory Visit
Admission: RE | Admit: 2022-10-14 | Discharge: 2022-10-14 | Disposition: A | Discharge status: Home / Self Care | Payer: PPO | Attending: Ophthalmology | Admitting: Ophthalmology

## 2022-10-14 ENCOUNTER — Ambulatory Visit: Payer: PPO | Admitting: Anesthesiology

## 2022-10-14 ENCOUNTER — Encounter
Admission: RE | Disposition: A | Discharge status: Home / Self Care | Payer: Self-pay | Source: Home / Self Care | Attending: Ophthalmology

## 2022-10-14 ENCOUNTER — Other Ambulatory Visit: Payer: Self-pay

## 2022-10-14 ENCOUNTER — Encounter: Payer: Self-pay | Admitting: Ophthalmology

## 2022-10-14 DIAGNOSIS — G4730 Sleep apnea, unspecified: Secondary | ICD-10-CM | POA: Diagnosis not present

## 2022-10-14 DIAGNOSIS — Z7985 Long-term (current) use of injectable non-insulin antidiabetic drugs: Secondary | ICD-10-CM | POA: Insufficient documentation

## 2022-10-14 DIAGNOSIS — Z7984 Long term (current) use of oral hypoglycemic drugs: Secondary | ICD-10-CM | POA: Diagnosis not present

## 2022-10-14 DIAGNOSIS — Z951 Presence of aortocoronary bypass graft: Secondary | ICD-10-CM | POA: Insufficient documentation

## 2022-10-14 DIAGNOSIS — Z87891 Personal history of nicotine dependence: Secondary | ICD-10-CM | POA: Diagnosis not present

## 2022-10-14 DIAGNOSIS — H2511 Age-related nuclear cataract, right eye: Secondary | ICD-10-CM | POA: Insufficient documentation

## 2022-10-14 DIAGNOSIS — I10 Essential (primary) hypertension: Secondary | ICD-10-CM | POA: Diagnosis not present

## 2022-10-14 DIAGNOSIS — E1136 Type 2 diabetes mellitus with diabetic cataract: Secondary | ICD-10-CM | POA: Diagnosis not present

## 2022-10-14 DIAGNOSIS — I2510 Atherosclerotic heart disease of native coronary artery without angina pectoris: Secondary | ICD-10-CM | POA: Diagnosis not present

## 2022-10-14 HISTORY — DX: Presence of external hearing-aid: Z97.4

## 2022-10-14 HISTORY — PX: CATARACT EXTRACTION W/PHACO: SHX586

## 2022-10-14 HISTORY — DX: Nonrheumatic mitral (valve) insufficiency: I34.0

## 2022-10-14 HISTORY — DX: Rheumatic tricuspid insufficiency: I07.1

## 2022-10-14 LAB — GLUCOSE, CAPILLARY: Glucose-Capillary: 140 mg/dL — ABNORMAL HIGH (ref 70–99)

## 2022-10-14 SURGERY — PHACOEMULSIFICATION, CATARACT, WITH IOL INSERTION
Anesthesia: Monitor Anesthesia Care | Site: Eye | Laterality: Right

## 2022-10-14 MED ORDER — SIGHTPATH DOSE#1 NA HYALUR & NA CHOND-NA HYALUR IO KIT
PACK | INTRAOCULAR | Status: DC | PRN
  Administered 2022-10-14: 1 via OPHTHALMIC

## 2022-10-14 MED ORDER — SIGHTPATH DOSE#1 BSS IO SOLN
INTRAOCULAR | Status: DC | PRN
  Administered 2022-10-14: 77 mL via OPHTHALMIC

## 2022-10-14 MED ORDER — SIGHTPATH DOSE#1 BSS IO SOLN
INTRAOCULAR | Status: DC | PRN
  Administered 2022-10-14: 15 mL via INTRAOCULAR

## 2022-10-14 MED ORDER — LACTATED RINGERS IV SOLN: INTRAVENOUS | Status: DC

## 2022-10-14 MED ORDER — ARMC OPHTHALMIC DILATING DROPS
1.0000 | OPHTHALMIC | Status: DC | PRN
  Administered 2022-10-14 (×3): 1 via OPHTHALMIC

## 2022-10-14 MED ORDER — BRIMONIDINE TARTRATE-TIMOLOL 0.2-0.5 % OP SOLN
OPHTHALMIC | Status: DC | PRN
  Administered 2022-10-14: 1 [drp] via OPHTHALMIC

## 2022-10-14 MED ORDER — CEFUROXIME OPHTHALMIC INJECTION 1 MG/0.1 ML
INJECTION | OPHTHALMIC | Status: DC | PRN
  Administered 2022-10-14: .1 mL via INTRACAMERAL

## 2022-10-14 MED ORDER — TETRACAINE HCL 0.5 % OP SOLN
1.0000 [drp] | OPHTHALMIC | Status: DC | PRN
  Administered 2022-10-14 (×3): 1 [drp] via OPHTHALMIC

## 2022-10-14 MED ORDER — SIGHTPATH DOSE#1 BSS IO SOLN
INTRAOCULAR | Status: DC | PRN
  Administered 2022-10-14: 2 mL

## 2022-10-14 MED ORDER — SODIUM CHLORIDE 0.9% FLUSH
INTRAVENOUS | Status: DC | PRN
  Administered 2022-10-14: 10 mL via INTRAVENOUS

## 2022-10-14 MED ORDER — MIDAZOLAM HCL 2 MG/2ML IJ SOLN
INTRAMUSCULAR | Status: DC | PRN
  Administered 2022-10-14: 2 mg via INTRAVENOUS

## 2022-10-14 MED ORDER — FENTANYL CITRATE (PF) 100 MCG/2ML IJ SOLN
INTRAMUSCULAR | Status: DC | PRN
  Administered 2022-10-14: 50 ug via INTRAVENOUS

## 2022-10-14 SURGICAL SUPPLY — 11 items
CANNULA ANT/CHMB 27G (MISCELLANEOUS) IMPLANT
CANNULA ANT/CHMB 27GA (MISCELLANEOUS) IMPLANT
CATARACT SUITE SIGHTPATH (MISCELLANEOUS) ×1 IMPLANT
FEE CATARACT SUITE SIGHTPATH (MISCELLANEOUS) ×1 IMPLANT
GLOVE SRG 8 PF TXTR STRL LF DI (GLOVE) ×1 IMPLANT
GLOVE SURG ENC TEXT LTX SZ7.5 (GLOVE) ×1 IMPLANT
GLOVE SURG UNDER POLY LF SZ8 (GLOVE) ×1
LENS IOL TECNIS EYHANCE 20.0 (Intraocular Lens) IMPLANT
NDL FILTER BLUNT 18X1 1/2 (NEEDLE) ×1 IMPLANT
NEEDLE FILTER BLUNT 18X1 1/2 (NEEDLE) ×1 IMPLANT
SYR 3ML LL SCALE MARK (SYRINGE) ×1 IMPLANT

## 2022-10-14 NOTE — Transfer of Care (Signed)
Immediate Anesthesia Transfer of Care Note  Patient: Phillip Le  Procedure(s) Performed: CATARACT EXTRACTION PHACO AND INTRAOCULAR LENS PLACEMENT (IOC) RIGHT DIABETIC 5.16 00:35.0 (Right: Eye)  Patient Location: PACU  Anesthesia Type:MAC  Level of Consciousness: awake  Airway & Oxygen Therapy: Patient Spontanous Breathing  Post-op Assessment: Report given to RN and Post -op Vital signs reviewed and stable  Post vital signs: Reviewed and stable  Last Vitals:  Vitals Value Taken Time  BP 115/87 10/14/22 1027  Temp 36.8 C 10/14/22 1028  Pulse 57 10/14/22 1029  Resp 12 10/14/22 1029  SpO2 94 % 10/14/22 1029  Vitals shown include unvalidated device data.  Last Pain:  Vitals:   10/14/22 1028  TempSrc:   PainSc: 0-No pain         Complications: No notable events documented.

## 2022-10-14 NOTE — Op Note (Signed)
LOCATION:  [facility]   PREOPERATIVE DIAGNOSIS:    Nuclear sclerotic cataract right eye. H25.11   POSTOPERATIVE DIAGNOSIS:  Nuclear sclerotic cataract right eye.     PROCEDURE:  Phacoemusification with posterior chamber intraocular lens placement of the right eye   ULTRASOUND TIME: Procedure(s): CATARACT EXTRACTION PHACO AND INTRAOCULAR LENS PLACEMENT (IOC) RIGHT DIABETIC 5.16 00:35.0 (Right)  LENS:   Implant Name Type Inv. Item Serial No. Manufacturer Lot No. LRB No. Used Action  LENS IOL TECNIS EYHANCE 20.0 - [id number] Intraocular Lens LENS IOL TECNIS EYHANCE 20.0 [id number] [employer]  Right 1 Implanted         SURGEON:  Deirdre Evener, MD   ANESTHESIA:  Topical with tetracaine drops and 2% Xylocaine jelly, augmented with 1% preservative-free intracameral lidocaine.    COMPLICATIONS:  None.   DESCRIPTION OF PROCEDURE:  The patient was identified in the holding room and transported to the operating room and placed in the supine position under the operating microscope.  The right eye was identified as the operative eye and it was prepped and draped in the usual sterile ophthalmic fashion.   A 1 millimeter clear-corneal paracentesis was made at the 12:00 position.  0.5 ml of preservative-free 1% lidocaine was injected into the anterior chamber. The anterior chamber was filled with Viscoat viscoelastic.  A 2.4 millimeter keratome was used to make a near-clear corneal incision at the 9:00 position.  A curvilinear capsulorrhexis was made with a cystotome and capsulorrhexis forceps.  Balanced salt solution was used to hydrodissect and hydrodelineate the nucleus.   Phacoemulsification was then used in stop and chop fashion to remove the lens nucleus and epinucleus.  The remaining cortex was then removed using the irrigation and aspiration handpiece. Provisc was then placed into the capsular bag to distend it for lens placement.  A lens was then injected into the capsular  bag.  The remaining viscoelastic was aspirated.   Wounds were hydrated with balanced salt solution.  The anterior chamber was inflated to a physiologic pressure with balanced salt solution.  No wound leaks were noted. Cefuroxime 0.1 ml of a 10mg /ml solution was injected into the anterior chamber for a dose of 1 mg of intracameral antibiotic at the completion of the case.   Timolol and Brimonidine drops were applied to the eye.  The patient was taken to the recovery room in stable condition without complications of anesthesia or surgery.   Danely Bayliss 10/14/2022, 10:26 AM

## 2022-10-14 NOTE — H&P (Signed)
[facility]   Primary Care Physician:  Marina Goodell, MD Ophthalmologist: Dr. Lockie Mola  Pre-Procedure History & Physical: HPI:  Phillip Le is a 72 y.o. male here for ophthalmic surgery.   Past Medical History:  Diagnosis Date   Allergy    Arthritis    Coronary artery disease    Diabetes mellitus without complication (HCC)    Dysrhythmia    palpitations   Elevated lipids    Gout    History of kidney stones    Hypertension    Mild mitral regurgitation by prior echocardiogram    Mild tricuspid regurgitation by prior echocardiogram    Sleep apnea    no cpap had weight loss   Wears glasses    Wears hearing aid in both ears    Has.  does not wear    Past Surgical History:  Procedure Laterality Date   ADENOIDECTOMY     APPENDECTOMY     COLONOSCOPY WITH PROPOFOL N/A 11/30/2016   Procedure: COLONOSCOPY WITH PROPOFOL;  Surgeon: Scot Jun, MD;  Location: [facility] ENDOSCOPY;  Service: Endoscopy;  Laterality: N/A;   COLONOSCOPY WITH PROPOFOL N/A 12/19/2021   Procedure: COLONOSCOPY WITH PROPOFOL;  Surgeon: Wyline Mood, MD;  Location: [facility] ENDOSCOPY;  Service: Gastroenterology;  Laterality: N/A;   CORONARY ARTERY BYPASS GRAFT N/A 06/23/2017   Procedure: CORONARY ARTERY BYPASS GRAFTING (CABG) x 4, on pump,LIMA to LAD, SVG to OM, SVG to DIAGONAL, SVG to DISTAL RCA, using left internal mammary artery and right greater saphenous vein harvested endoscopically;  Surgeon: Delight Ovens, MD;  Location: [facility] OR;  Service: Open Heart Surgery;  Laterality: N/A;   INGUINAL HERNIA REPAIR Right 04/30/2022   Procedure: RIGHT OPEN INGUINAL HERNIA REPAIR WITH MESH;  Surgeon: Kinsinger, De Blanch, MD;  Location: [facility] ORS;  Service: General;  Laterality: Right;   LEFT HEART CATH AND CORONARY ANGIOGRAPHY Left 06/18/2017   Procedure: LEFT HEART CATH AND CORONARY ANGIOGRAPHY;  Surgeon: Dalia Heading, MD;  Location: [facility] INVASIVE CV LAB;  Service: Cardiovascular;  Laterality:  Left;   SHOULDER SURGERY Right    approx 8-10 years ago   TEE WITHOUT CARDIOVERSION N/A 06/23/2017   Procedure: TRANSESOPHAGEAL ECHOCARDIOGRAM (TEE);  Surgeon: Delight Ovens, MD;  Location: [facility] OR;  Service: Open Heart Surgery;  Laterality: N/A;   TONSILLECTOMY     UMBILICAL HERNIA REPAIR N/A 04/30/2022   Procedure: OPEN UMBILICAL HERNIA REPAIR;  Surgeon: Sheliah Hatch, De Blanch, MD;  Location: [facility] ORS;  Service: General;  Laterality: N/A;    Prior to Admission medications   Medication Sig Start Date End Date Taking? Authorizing Provider  aspirin 81 MG EC tablet Take 81 mg by mouth once.   Yes [provider]  ibuprofen (ADVIL) 800 MG tablet Take 1 tablet (800 mg total) by mouth every 8 (eight) hours as needed. 04/30/22  Yes Kinsinger, De Blanch, MD  metFORMIN (GLUCOPHAGE) 500 MG tablet Take 500 mg by mouth daily with breakfast. 10/13/19  Yes [provider]  Multiple Vitamins-Minerals (MULTIVITAMIN WITH MINERALS) tablet Take 1 tablet by mouth daily.   Yes [provider]  ramipril (ALTACE) 5 MG capsule Take 1 capsule (5 mg total) by mouth daily. Patient taking differently: Take 5 mg by mouth 2 (two) times daily. 06/28/17  Yes Barrett, Erin R, PA-C  sildenafil (VIAGRA) 100 MG tablet Take 100 mg by mouth as needed for erectile dysfunction.   Yes [provider]  Semaglutide,0.25 or 0.5MG /DOS, (OZEMPIC, 0.25 OR 0.5 MG/DOSE,) 2  MG/1.5ML SOPN Inject 0.25 mg into the skin once a week. Not taking at this time 04-28-22 due to cost of medication Patient not taking: Reported on 10/06/2022    [provider]    Allergies as of 09/24/2022   (No Known Allergies)    Family History  Problem Relation Age of Onset   CAD Mother    Cancer Father     Social History   Socioeconomic History   Marital status: Married    Spouse name: Not on file   Number of children: Not on file   Years of education: Not on file   Highest education level: Not on file   Occupational History   Not on file  Tobacco Use   Smoking status: Former    Years: 10    Types: Cigarettes    Quit date: 06/19/1978    Years since quitting: 44.3   Smokeless tobacco: Never  Vaping Use   Vaping Use: Never used  Substance and Sexual Activity   Alcohol use: No   Drug use: No   Sexual activity: Yes    Birth control/protection: None  Other Topics Concern   Not on file  Social History Narrative   Not on file   Social Determinants of Health   Financial Resource Strain: Not on file  Food Insecurity: Not on file  Transportation Needs: Not on file  Physical Activity: Not on file  Stress: Not on file  Social Connections: Not on file  Intimate Partner Violence: Not on file    Review of Systems: See HPI, otherwise negative ROS  Physical Exam: BP 133/84   Pulse 66   Temp 97.6 F (36.4 C) (Temporal)   Resp 18   Ht 5\' 8"  (1.727 m)   Wt 83.9 kg   SpO2 96%   BMI 28.12 kg/m  General:   Alert,  pleasant and cooperative in NAD Head:  Normocephalic and atraumatic. Lungs:  Clear to auscultation.    Heart:  Regular rate and rhythm.   Impression/Plan: Phillip Le is here for ophthalmic surgery.  Risks, benefits, limitations, and alternatives regarding ophthalmic surgery have been reviewed with the patient.  Questions have been answered.  All parties agreeable.   Lockie Mola, MD  10/14/2022, 9:08 AM

## 2022-10-14 NOTE — Anesthesia Postprocedure Evaluation (Signed)
Anesthesia Post Note  Patient: Phillip Le  Procedure(s) Performed: CATARACT EXTRACTION PHACO AND INTRAOCULAR LENS PLACEMENT (IOC) RIGHT DIABETIC 5.16 00:35.0 (Right: Eye)  Patient location during evaluation: PACU Anesthesia Type: MAC Level of consciousness: awake and alert Pain management: pain level controlled Vital Signs Assessment: post-procedure vital signs reviewed and stable Respiratory status: spontaneous breathing, nonlabored ventilation, respiratory function stable and patient connected to nasal cannula oxygen Cardiovascular status: stable and blood pressure returned to baseline Postop Assessment: no apparent nausea or vomiting Anesthetic complications: no   No notable events documented.   Last Vitals:  Vitals:   10/14/22 1030 10/14/22 1033  BP: 118/74   Pulse: (!) 58 (!) 42  Resp: (!) 9 (!) 0  Temp:    SpO2: 92% 92%    Last Pain:  Vitals:   10/14/22 1033  TempSrc:   PainSc: 0-No pain                 Nicko Daher C Melonie Germani

## 2022-10-15 ENCOUNTER — Encounter: Payer: Self-pay | Admitting: Ophthalmology

## 2022-10-19 NOTE — Anesthesia Preprocedure Evaluation (Addendum)
Anesthesia Evaluation  Patient identified by MRN, date of birth, ID band Patient awake    Reviewed: Allergy & Precautions, H&P , NPO status , Patient's Chart, lab work & pertinent test results  Airway Mallampati: I  TM Distance: <3 FB Neck ROM: Full    Dental no notable dental hx.    Pulmonary neg pulmonary ROS, sleep apnea , former smoker Patient and wife state that since he has lost weight, he doesn't seem to have problems with sleep apnea anymore      Pulmonary exam normal breath sounds clear to auscultation       Cardiovascular hypertension, + CAD  negative cardio ROS Normal cardiovascular exam+ dysrhythmias  Rhythm:Regular Rate:Normal  March 2019 Left ventricle: The cavity size was normal. Systolic function was    vigorous. The estimated ejection fraction was in the range of 65%    to 70%. Wall motion was normal; there were no regional wall    motion abnormalities. Left ventricular diastolic function was not    evaluated.  - Aortic valve: Sclerosis without stenosis. There was trivial    regurgitation.  - Mitral valve: There was mild regurgitation.  - Right ventricle: The cavity size was normal. Wall thickness was    normal. Systolic function was normal.  - Right atrium: The atrium was normal in size.  - Tricuspid valve: There was mild regurgitation.  - Pulmonary arteries: Systolic pressure was within the normal    range.  - Inferior vena cava: The vessel was normal in size.  - Pericardium, extracardiac: There was no pericardial effusion.  Dist LM lesion is 60% stenosed. Ost LAD lesion is 75% stenosed. Ost 1st Diag lesion is 99% stenosed. Ost Cx lesion is 70% stenosed. Prox LAD lesion is 70% stenosed. Mid RCA lesion is 60% stenosed. The left ventricular systolic function is normal. LV end diastolic pressure is normal. There is no aortic valve stenosis. There is no mitral valve stenosis and no mitral valve  prolapse evident.   LM 60% stenosis LAD 75% ostial stenosis, 70% mid D1 99% ostial stenosis LCx 70% ostial Stenosis RCA-60% mid stenosis   Referred for CABG, S/P CABG   06-18-17     Neuro/Psych negative neurological ROS  negative psych ROS   GI/Hepatic negative GI ROS, Neg liver ROS,,,  Endo/Other  negative endocrine ROSdiabetes    Renal/GU negative Renal ROS  negative genitourinary   Musculoskeletal negative musculoskeletal ROS (+) Arthritis ,    Abdominal   Peds negative pediatric ROS (+)  Hematology negative hematology ROS (+)   Anesthesia Other Findings Diabetes mellitus without complication Hypertension Allergy Gout Sleep apnea Elevated lipids Dysrhythmia Coronary artery disease History of kidney stones  Previous cataract surgery 10-14-22 Arthritis Wears glasses Wears hearing aid in both ears Mild mitral regurgitation by prior echocardiogram Mild tricuspid regurgitation by prior echocardiogram   Reproductive/Obstetrics negative OB ROS                             Anesthesia Physical Anesthesia Plan  ASA: 3  Anesthesia Plan: MAC   Post-op Pain Management:    Induction: Intravenous  PONV Risk Score and Plan:   Airway Management Planned: Natural Airway and Nasal Cannula  Additional Equipment:   Intra-op Plan:   Post-operative Plan:   Informed Consent: I have reviewed the patients History and Physical, chart, labs and discussed the procedure including the risks, benefits and alternatives for the proposed anesthesia with the patient or authorized  representative who has indicated his/her understanding and acceptance.     Dental Advisory Given  Plan Discussed with: Anesthesiologist, CRNA and Surgeon  Anesthesia Plan Comments: (Patient consented for risks of anesthesia including but not limited to:  - adverse reactions to medications - damage to eyes, teeth, lips or other oral mucosa - nerve damage due to  positioning  - sore throat or hoarseness - Damage to heart, brain, nerves, lungs, other parts of body or loss of life  Patient voiced understanding.)        Anesthesia Quick Evaluation

## 2022-10-19 NOTE — Discharge Instructions (Signed)

## 2022-10-21 ENCOUNTER — Ambulatory Visit
Admission: RE | Admit: 2022-10-21 | Discharge: 2022-10-21 | Disposition: A | Discharge status: Home / Self Care | Payer: PPO | Attending: Ophthalmology | Admitting: Ophthalmology

## 2022-10-21 ENCOUNTER — Ambulatory Visit: Payer: Self-pay | Admitting: Anesthesiology

## 2022-10-21 ENCOUNTER — Encounter
Admission: RE | Disposition: A | Discharge status: Home / Self Care | Payer: Self-pay | Source: Home / Self Care | Attending: Ophthalmology

## 2022-10-21 ENCOUNTER — Ambulatory Visit: Payer: PPO | Admitting: Anesthesiology

## 2022-10-21 ENCOUNTER — Encounter: Payer: Self-pay | Admitting: Ophthalmology

## 2022-10-21 ENCOUNTER — Other Ambulatory Visit: Payer: Self-pay

## 2022-10-21 DIAGNOSIS — I10 Essential (primary) hypertension: Secondary | ICD-10-CM | POA: Diagnosis not present

## 2022-10-21 DIAGNOSIS — Z87891 Personal history of nicotine dependence: Secondary | ICD-10-CM | POA: Insufficient documentation

## 2022-10-21 DIAGNOSIS — E1136 Type 2 diabetes mellitus with diabetic cataract: Secondary | ICD-10-CM | POA: Diagnosis not present

## 2022-10-21 DIAGNOSIS — E1169 Type 2 diabetes mellitus with other specified complication: Secondary | ICD-10-CM | POA: Diagnosis not present

## 2022-10-21 DIAGNOSIS — H2512 Age-related nuclear cataract, left eye: Secondary | ICD-10-CM | POA: Diagnosis not present

## 2022-10-21 DIAGNOSIS — Z7984 Long term (current) use of oral hypoglycemic drugs: Secondary | ICD-10-CM | POA: Diagnosis not present

## 2022-10-21 DIAGNOSIS — I2510 Atherosclerotic heart disease of native coronary artery without angina pectoris: Secondary | ICD-10-CM | POA: Diagnosis not present

## 2022-10-21 HISTORY — PX: CATARACT EXTRACTION W/PHACO: SHX586

## 2022-10-21 LAB — GLUCOSE, CAPILLARY: Glucose-Capillary: 136 mg/dL — ABNORMAL HIGH (ref 70–99)

## 2022-10-21 SURGERY — PHACOEMULSIFICATION, CATARACT, WITH IOL INSERTION
Anesthesia: Monitor Anesthesia Care | Laterality: Left

## 2022-10-21 MED ORDER — FENTANYL CITRATE (PF) 100 MCG/2ML IJ SOLN
INTRAMUSCULAR | Status: DC | PRN
  Administered 2022-10-21: 25 ug via INTRAVENOUS

## 2022-10-21 MED ORDER — CEFUROXIME OPHTHALMIC INJECTION 1 MG/0.1 ML
INJECTION | OPHTHALMIC | Status: DC | PRN
  Administered 2022-10-21: 1 mg via INTRACAMERAL

## 2022-10-21 MED ORDER — SIGHTPATH DOSE#1 NA HYALUR & NA CHOND-NA HYALUR IO KIT
PACK | INTRAOCULAR | Status: DC | PRN
  Administered 2022-10-21: 1 via OPHTHALMIC

## 2022-10-21 MED ORDER — MIDAZOLAM HCL 2 MG/2ML IJ SOLN
INTRAMUSCULAR | Status: DC | PRN
  Administered 2022-10-21: 2 mg via INTRAVENOUS

## 2022-10-21 MED ORDER — LACTATED RINGERS IV SOLN: INTRAVENOUS | Status: DC

## 2022-10-21 MED ORDER — SIGHTPATH DOSE#1 BSS IO SOLN
INTRAOCULAR | Status: DC | PRN
  Administered 2022-10-21: 15 mL via INTRAOCULAR

## 2022-10-21 MED ORDER — SIGHTPATH DOSE#1 BSS IO SOLN
INTRAOCULAR | Status: DC | PRN
  Administered 2022-10-21: 2 mL

## 2022-10-21 MED ORDER — TETRACAINE HCL 0.5 % OP SOLN
1.0000 [drp] | OPHTHALMIC | Status: DC | PRN
  Administered 2022-10-21 (×3): 1 [drp] via OPHTHALMIC

## 2022-10-21 MED ORDER — SIGHTPATH DOSE#1 BSS IO SOLN
INTRAOCULAR | Status: DC | PRN
  Administered 2022-10-21: 83 mL via OPHTHALMIC

## 2022-10-21 MED ORDER — ARMC OPHTHALMIC DILATING DROPS
1.0000 | OPHTHALMIC | Status: DC | PRN
  Administered 2022-10-21 (×3): 1 via OPHTHALMIC

## 2022-10-21 MED ORDER — BRIMONIDINE TARTRATE-TIMOLOL 0.2-0.5 % OP SOLN
OPHTHALMIC | Status: DC | PRN
  Administered 2022-10-21: 1 [drp] via OPHTHALMIC

## 2022-10-21 SURGICAL SUPPLY — 9 items
CATARACT SUITE SIGHTPATH (MISCELLANEOUS) ×1 IMPLANT
FEE CATARACT SUITE SIGHTPATH (MISCELLANEOUS) ×1 IMPLANT
GLOVE SRG 8 PF TXTR STRL LF DI (GLOVE) ×1 IMPLANT
GLOVE SURG ENC TEXT LTX SZ7.5 (GLOVE) ×1 IMPLANT
GLOVE SURG UNDER POLY LF SZ8 (GLOVE) ×1
LENS IOL TECNIS EYHANCE 20.5 (Intraocular Lens) IMPLANT
NDL FILTER BLUNT 18X1 1/2 (NEEDLE) ×1 IMPLANT
NEEDLE FILTER BLUNT 18X1 1/2 (NEEDLE) ×1 IMPLANT
SYR 3ML LL SCALE MARK (SYRINGE) ×1 IMPLANT

## 2022-10-21 NOTE — Op Note (Signed)
OPERATIVE NOTE  Phillip Le [id number] 10/21/2022   PREOPERATIVE DIAGNOSIS:  Nuclear sclerotic cataract left eye. H25.12   POSTOPERATIVE DIAGNOSIS:    Nuclear sclerotic cataract left eye.     PROCEDURE:  Phacoemusification with posterior chamber intraocular lens placement of the left eye  Ultrasound time: Procedure(s): CATARACT EXTRACTION PHACO AND INTRAOCULAR LENS PLACEMENT (IOC) LEFT DIABETIC 3.17 00:20.7 (Left)  LENS:   Implant Name Type Inv. Item Serial No. Manufacturer Lot No. LRB No. Used Action  LENS IOL TECNIS EYHANCE 20.5 - [id number] Intraocular Lens LENS IOL TECNIS EYHANCE 20.5 [id number] [employer]  Left 1 Implanted      SURGEON:  Deirdre Evener, MD   ANESTHESIA:  Topical with tetracaine drops and 2% Xylocaine jelly, augmented with 1% preservative-free intracameral lidocaine.    COMPLICATIONS:  None.   DESCRIPTION OF PROCEDURE:  The patient was identified in the holding room and transported to the operating room and placed in the supine position under the operating microscope.  The left eye was identified as the operative eye and it was prepped and draped in the usual sterile ophthalmic fashion.   A 1 millimeter clear-corneal paracentesis was made at the 1:30 position.  0.5 ml of preservative-free 1% lidocaine was injected into the anterior chamber.  The anterior chamber was filled with Viscoat viscoelastic.  A 2.4 millimeter keratome was used to make a near-clear corneal incision at the 10:30 position.  .  A curvilinear capsulorrhexis was made with a cystotome and capsulorrhexis forceps.  Balanced salt solution was used to hydrodissect and hydrodelineate the nucleus.   Phacoemulsification was then used in stop and chop fashion to remove the lens nucleus and epinucleus.  The remaining cortex was then removed using the irrigation and aspiration handpiece. Provisc was then placed into the capsular bag to distend it for lens placement.  A lens was then injected  into the capsular bag.  The remaining viscoelastic was aspirated.   Wounds were hydrated with balanced salt solution.  The anterior chamber was inflated to a physiologic pressure with balanced salt solution.  No wound leaks were noted. Cefuroxime 0.1 ml of a 10mg /ml solution was injected into the anterior chamber for a dose of 1 mg of intracameral antibiotic at the completion of the case.   Timolol and Brimonidine drops were applied to the eye.  The patient was taken to the recovery room in stable condition without complications of anesthesia or surgery.  Phillip Le 10/21/2022, 9:46 AM

## 2022-10-21 NOTE — H&P (Signed)
[facility]   Primary Care Physician:  Marina Goodell, MD Ophthalmologist: Dr. Lockie Mola  Pre-Procedure History & Physical: HPI:  Phillip Le is a 72 y.o. male here for ophthalmic surgery.   Past Medical History:  Diagnosis Date   Allergy    Arthritis    Coronary artery disease    Diabetes mellitus without complication (HCC)    Dysrhythmia    palpitations   Elevated lipids    Gout    History of kidney stones    Hypertension    Mild mitral regurgitation by prior echocardiogram    Mild tricuspid regurgitation by prior echocardiogram    Sleep apnea    no cpap had weight loss   Wears glasses    Wears hearing aid in both ears    Has.  does not wear    Past Surgical History:  Procedure Laterality Date   ADENOIDECTOMY     APPENDECTOMY     CATARACT EXTRACTION W/PHACO Right 10/14/2022   Procedure: CATARACT EXTRACTION PHACO AND INTRAOCULAR LENS PLACEMENT (IOC) RIGHT DIABETIC 5.16 00:35.0;  Surgeon: Lockie Mola, MD;  Location: [facility];  Service: Ophthalmology;  Laterality: Right;   COLONOSCOPY WITH PROPOFOL N/A 11/30/2016   Procedure: COLONOSCOPY WITH PROPOFOL;  Surgeon: Scot Jun, MD;  Location: [facility] ENDOSCOPY;  Service: Endoscopy;  Laterality: N/A;   COLONOSCOPY WITH PROPOFOL N/A 12/19/2021   Procedure: COLONOSCOPY WITH PROPOFOL;  Surgeon: Wyline Mood, MD;  Location: [facility] ENDOSCOPY;  Service: Gastroenterology;  Laterality: N/A;   CORONARY ARTERY BYPASS GRAFT N/A 06/23/2017   Procedure: CORONARY ARTERY BYPASS GRAFTING (CABG) x 4, on pump,LIMA to LAD, SVG to OM, SVG to DIAGONAL, SVG to DISTAL RCA, using left internal mammary artery and right greater saphenous vein harvested endoscopically;  Surgeon: Delight Ovens, MD;  Location: [facility] OR;  Service: Open Heart Surgery;  Laterality: N/A;   INGUINAL HERNIA REPAIR Right 04/30/2022   Procedure: RIGHT OPEN INGUINAL HERNIA REPAIR WITH MESH;  Surgeon: Kinsinger, De Blanch, MD;  Location:  [facility] ORS;  Service: General;  Laterality: Right;   LEFT HEART CATH AND CORONARY ANGIOGRAPHY Left 06/18/2017   Procedure: LEFT HEART CATH AND CORONARY ANGIOGRAPHY;  Surgeon: Dalia Heading, MD;  Location: [facility] INVASIVE CV LAB;  Service: Cardiovascular;  Laterality: Left;   SHOULDER SURGERY Right    approx 8-10 years ago   TEE WITHOUT CARDIOVERSION N/A 06/23/2017   Procedure: TRANSESOPHAGEAL ECHOCARDIOGRAM (TEE);  Surgeon: Delight Ovens, MD;  Location: [facility] OR;  Service: Open Heart Surgery;  Laterality: N/A;   TONSILLECTOMY     UMBILICAL HERNIA REPAIR N/A 04/30/2022   Procedure: OPEN UMBILICAL HERNIA REPAIR;  Surgeon: Sheliah Hatch, De Blanch, MD;  Location: [facility] ORS;  Service: General;  Laterality: N/A;    Prior to Admission medications   Medication Sig Start Date End Date Taking? Authorizing Provider  aspirin 81 MG EC tablet Take 81 mg by mouth once.   Yes [provider]  ibuprofen (ADVIL) 800 MG tablet Take 1 tablet (800 mg total) by mouth every 8 (eight) hours as needed. 04/30/22  Yes Kinsinger, De Blanch, MD  metFORMIN (GLUCOPHAGE) 500 MG tablet Take 500 mg by mouth daily with breakfast. 10/13/19  Yes [provider]  Multiple Vitamins-Minerals (MULTIVITAMIN WITH MINERALS) tablet Take 1 tablet by mouth daily.   Yes [provider]  ramipril (ALTACE) 5 MG capsule Take 1 capsule (5 mg total) by mouth daily. Patient taking differently: Take 5 mg by mouth 2 (two) times daily. 06/28/17  Yes Barrett, Erin R, PA-C  Semaglutide,0.25 or 0.5MG /DOS, (OZEMPIC, 0.25 OR 0.5 MG/DOSE,) 2 MG/1.5ML SOPN Inject 0.25 mg into the skin once a week. Not taking at this time 04-28-22 due to cost of medication   Yes [provider]  sildenafil (VIAGRA) 100 MG tablet Take 100 mg by mouth as needed for erectile dysfunction.   Yes [provider]    Allergies as of 09/24/2022   (No Known Allergies)    Family History  Problem Relation Age of Onset   CAD Mother    Cancer Father      Social History   Socioeconomic History   Marital status: Married    Spouse name: Not on file   Number of children: Not on file   Years of education: Not on file   Highest education level: Not on file  Occupational History   Not on file  Tobacco Use   Smoking status: Former    Current packs/day: 0.00    Types: Cigarettes    Start date: 06/18/1968    Quit date: 06/19/1978    Years since quitting: 44.3   Smokeless tobacco: Never  Vaping Use   Vaping status: Never Used  Substance and Sexual Activity   Alcohol use: No   Drug use: No   Sexual activity: Yes    Birth control/protection: None  Other Topics Concern   Not on file  Social History Narrative   Not on file   Social Determinants of Health   Financial Resource Strain: Not on file  Food Insecurity: Not on file  Transportation Needs: Not on file  Physical Activity: Not on file  Stress: Not on file  Social Connections: Not on file  Intimate Partner Violence: Not on file    Review of Systems: See HPI, otherwise negative ROS  Physical Exam: There were no vitals taken for this visit. General:   Alert,  pleasant and cooperative in NAD Head:  Normocephalic and atraumatic. Lungs:  Clear to auscultation.    Heart:  Regular rate and rhythm.   Impression/Plan: Phillip Le is here for ophthalmic surgery.  Risks, benefits, limitations, and alternatives regarding ophthalmic surgery have been reviewed with the patient.  Questions have been answered.  All parties agreeable.   Lockie Mola, MD  10/21/2022, 8:36 AM

## 2022-10-21 NOTE — Anesthesia Postprocedure Evaluation (Signed)
Anesthesia Post Note  Patient: Phillip Le  Procedure(s) Performed: CATARACT EXTRACTION PHACO AND INTRAOCULAR LENS PLACEMENT (IOC) LEFT DIABETIC 3.17 00:20.7 (Left)  Patient location during evaluation: PACU Anesthesia Type: MAC Level of consciousness: awake and alert Pain management: pain level controlled Vital Signs Assessment: post-procedure vital signs reviewed and stable Respiratory status: spontaneous breathing, nonlabored ventilation, respiratory function stable and patient connected to nasal cannula oxygen Cardiovascular status: stable and blood pressure returned to baseline Postop Assessment: no apparent nausea or vomiting Anesthetic complications: no   No notable events documented.   Last Vitals:  Vitals:   10/21/22 0950 10/21/22 0954  BP: 120/85 (!) 122/90  Pulse: 64 (!) 58  Resp: 16 14  Temp:  (!) 36.2 C  SpO2: 94% 96%    Last Pain:  Vitals:   10/21/22 0954  TempSrc:   PainSc: 0-No pain                 Lillyen Schow C Florance Paolillo

## 2022-10-21 NOTE — Transfer of Care (Signed)
Immediate Anesthesia Transfer of Care Note  Patient: Phillip Le  Procedure(s) Performed: CATARACT EXTRACTION PHACO AND INTRAOCULAR LENS PLACEMENT (IOC) LEFT DIABETIC 3.17 00:20.7 (Left)  Patient Location: PACU  Anesthesia Type: MAC  Level of Consciousness: awake, alert  and patient cooperative  Airway and Oxygen Therapy: Patient Spontanous Breathing and Patient connected to supplemental oxygen  Post-op Assessment: Post-op Vital signs reviewed, Patient's Cardiovascular Status Stable, Respiratory Function Stable, Patent Airway and No signs of Nausea or vomiting  Post-op Vital Signs: Reviewed and stable  Complications: No notable events documented.

## 2022-10-22 ENCOUNTER — Encounter: Payer: Self-pay | Admitting: Ophthalmology

## 2022-11-13 DIAGNOSIS — Z961 Presence of intraocular lens: Secondary | ICD-10-CM | POA: Diagnosis not present

## 2022-11-23 DIAGNOSIS — Z951 Presence of aortocoronary bypass graft: Secondary | ICD-10-CM | POA: Diagnosis not present

## 2022-11-23 DIAGNOSIS — E7800 Pure hypercholesterolemia, unspecified: Secondary | ICD-10-CM | POA: Diagnosis not present

## 2022-12-28 DIAGNOSIS — J208 Acute bronchitis due to other specified organisms: Secondary | ICD-10-CM | POA: Diagnosis not present

## 2022-12-28 DIAGNOSIS — R0981 Nasal congestion: Secondary | ICD-10-CM | POA: Diagnosis not present

## 2023-01-27 DIAGNOSIS — E1165 Type 2 diabetes mellitus with hyperglycemia: Secondary | ICD-10-CM | POA: Diagnosis not present

## 2023-02-02 DIAGNOSIS — I2510 Atherosclerotic heart disease of native coronary artery without angina pectoris: Secondary | ICD-10-CM | POA: Diagnosis not present

## 2023-02-02 DIAGNOSIS — E1165 Type 2 diabetes mellitus with hyperglycemia: Secondary | ICD-10-CM | POA: Diagnosis not present

## 2023-02-02 DIAGNOSIS — Z0000 Encounter for general adult medical examination without abnormal findings: Secondary | ICD-10-CM | POA: Diagnosis not present

## 2023-02-02 DIAGNOSIS — E7800 Pure hypercholesterolemia, unspecified: Secondary | ICD-10-CM | POA: Diagnosis not present

## 2023-02-02 DIAGNOSIS — I700 Atherosclerosis of aorta: Secondary | ICD-10-CM | POA: Diagnosis not present

## 2023-02-02 DIAGNOSIS — I10 Essential (primary) hypertension: Secondary | ICD-10-CM | POA: Diagnosis not present

## 2023-02-02 DIAGNOSIS — Z1331 Encounter for screening for depression: Secondary | ICD-10-CM | POA: Diagnosis not present

## 2023-02-02 DIAGNOSIS — N401 Enlarged prostate with lower urinary tract symptoms: Secondary | ICD-10-CM | POA: Diagnosis not present

## 2023-04-05 DIAGNOSIS — R12 Heartburn: Secondary | ICD-10-CM | POA: Diagnosis not present

## 2023-04-05 DIAGNOSIS — E119 Type 2 diabetes mellitus without complications: Secondary | ICD-10-CM | POA: Diagnosis not present

## 2023-04-05 DIAGNOSIS — E7800 Pure hypercholesterolemia, unspecified: Secondary | ICD-10-CM | POA: Diagnosis not present

## 2023-04-05 DIAGNOSIS — Z794 Long term (current) use of insulin: Secondary | ICD-10-CM | POA: Diagnosis not present

## 2023-04-05 DIAGNOSIS — I25810 Atherosclerosis of coronary artery bypass graft(s) without angina pectoris: Secondary | ICD-10-CM | POA: Diagnosis not present

## 2023-04-05 DIAGNOSIS — I10 Essential (primary) hypertension: Secondary | ICD-10-CM | POA: Diagnosis not present

## 2023-04-05 DIAGNOSIS — Z951 Presence of aortocoronary bypass graft: Secondary | ICD-10-CM | POA: Diagnosis not present

## 2023-04-05 DIAGNOSIS — G4734 Idiopathic sleep related nonobstructive alveolar hypoventilation: Secondary | ICD-10-CM | POA: Diagnosis not present

## 2023-04-22 DIAGNOSIS — I25810 Atherosclerosis of coronary artery bypass graft(s) without angina pectoris: Secondary | ICD-10-CM | POA: Diagnosis not present

## 2023-04-22 DIAGNOSIS — Z951 Presence of aortocoronary bypass graft: Secondary | ICD-10-CM | POA: Diagnosis not present

## 2023-05-14 DIAGNOSIS — I10 Essential (primary) hypertension: Secondary | ICD-10-CM | POA: Diagnosis not present

## 2023-06-16 DIAGNOSIS — L821 Other seborrheic keratosis: Secondary | ICD-10-CM | POA: Diagnosis not present

## 2023-06-16 DIAGNOSIS — D2262 Melanocytic nevi of left upper limb, including shoulder: Secondary | ICD-10-CM | POA: Diagnosis not present

## 2023-06-16 DIAGNOSIS — D225 Melanocytic nevi of trunk: Secondary | ICD-10-CM | POA: Diagnosis not present

## 2023-06-16 DIAGNOSIS — D2261 Melanocytic nevi of right upper limb, including shoulder: Secondary | ICD-10-CM | POA: Diagnosis not present

## 2023-06-16 DIAGNOSIS — D2272 Melanocytic nevi of left lower limb, including hip: Secondary | ICD-10-CM | POA: Diagnosis not present

## 2023-06-16 DIAGNOSIS — D2271 Melanocytic nevi of right lower limb, including hip: Secondary | ICD-10-CM | POA: Diagnosis not present

## 2023-06-16 DIAGNOSIS — L570 Actinic keratosis: Secondary | ICD-10-CM | POA: Diagnosis not present

## 2023-06-17 DIAGNOSIS — Z961 Presence of intraocular lens: Secondary | ICD-10-CM | POA: Diagnosis not present

## 2023-06-17 DIAGNOSIS — H40003 Preglaucoma, unspecified, bilateral: Secondary | ICD-10-CM | POA: Diagnosis not present

## 2023-06-17 DIAGNOSIS — E119 Type 2 diabetes mellitus without complications: Secondary | ICD-10-CM | POA: Diagnosis not present

## 2023-06-17 DIAGNOSIS — H11122 Conjunctival concretions, left eye: Secondary | ICD-10-CM | POA: Diagnosis not present

## 2023-08-02 DIAGNOSIS — E7800 Pure hypercholesterolemia, unspecified: Secondary | ICD-10-CM | POA: Diagnosis not present

## 2023-08-02 DIAGNOSIS — E1165 Type 2 diabetes mellitus with hyperglycemia: Secondary | ICD-10-CM | POA: Diagnosis not present

## 2023-08-04 DIAGNOSIS — E1165 Type 2 diabetes mellitus with hyperglycemia: Secondary | ICD-10-CM | POA: Diagnosis not present

## 2023-08-04 DIAGNOSIS — I2510 Atherosclerotic heart disease of native coronary artery without angina pectoris: Secondary | ICD-10-CM | POA: Diagnosis not present

## 2023-08-04 DIAGNOSIS — N529 Male erectile dysfunction, unspecified: Secondary | ICD-10-CM | POA: Diagnosis not present

## 2023-08-04 DIAGNOSIS — E7800 Pure hypercholesterolemia, unspecified: Secondary | ICD-10-CM | POA: Diagnosis not present

## 2023-08-04 DIAGNOSIS — N401 Enlarged prostate with lower urinary tract symptoms: Secondary | ICD-10-CM | POA: Diagnosis not present

## 2023-08-04 DIAGNOSIS — I10 Essential (primary) hypertension: Secondary | ICD-10-CM | POA: Diagnosis not present

## 2023-08-04 DIAGNOSIS — I700 Atherosclerosis of aorta: Secondary | ICD-10-CM | POA: Diagnosis not present

## 2023-12-29 DIAGNOSIS — I25810 Atherosclerosis of coronary artery bypass graft(s) without angina pectoris: Secondary | ICD-10-CM | POA: Diagnosis not present

## 2023-12-29 DIAGNOSIS — I10 Essential (primary) hypertension: Secondary | ICD-10-CM | POA: Diagnosis not present

## 2023-12-29 DIAGNOSIS — R0789 Other chest pain: Secondary | ICD-10-CM | POA: Diagnosis not present

## 2023-12-29 DIAGNOSIS — Z794 Long term (current) use of insulin: Secondary | ICD-10-CM | POA: Diagnosis not present

## 2023-12-29 DIAGNOSIS — E7800 Pure hypercholesterolemia, unspecified: Secondary | ICD-10-CM | POA: Diagnosis not present

## 2023-12-29 DIAGNOSIS — Z951 Presence of aortocoronary bypass graft: Secondary | ICD-10-CM | POA: Diagnosis not present

## 2023-12-29 DIAGNOSIS — E119 Type 2 diabetes mellitus without complications: Secondary | ICD-10-CM | POA: Diagnosis not present

## 2024-01-26 DIAGNOSIS — I10 Essential (primary) hypertension: Secondary | ICD-10-CM | POA: Diagnosis not present

## 2024-01-26 DIAGNOSIS — I25810 Atherosclerosis of coronary artery bypass graft(s) without angina pectoris: Secondary | ICD-10-CM | POA: Diagnosis not present

## 2024-01-26 DIAGNOSIS — Z951 Presence of aortocoronary bypass graft: Secondary | ICD-10-CM | POA: Diagnosis not present

## 2024-01-26 DIAGNOSIS — Z23 Encounter for immunization: Secondary | ICD-10-CM | POA: Diagnosis not present

## 2024-01-26 DIAGNOSIS — E7800 Pure hypercholesterolemia, unspecified: Secondary | ICD-10-CM | POA: Diagnosis not present

## 2024-01-28 DIAGNOSIS — E7800 Pure hypercholesterolemia, unspecified: Secondary | ICD-10-CM | POA: Diagnosis not present

## 2024-01-28 DIAGNOSIS — E1165 Type 2 diabetes mellitus with hyperglycemia: Secondary | ICD-10-CM | POA: Diagnosis not present

## 2024-02-08 DIAGNOSIS — E1165 Type 2 diabetes mellitus with hyperglycemia: Secondary | ICD-10-CM | POA: Diagnosis not present

## 2024-02-08 DIAGNOSIS — M791 Myalgia, unspecified site: Secondary | ICD-10-CM | POA: Diagnosis not present

## 2024-02-08 DIAGNOSIS — N401 Enlarged prostate with lower urinary tract symptoms: Secondary | ICD-10-CM | POA: Diagnosis not present

## 2024-02-08 DIAGNOSIS — N529 Male erectile dysfunction, unspecified: Secondary | ICD-10-CM | POA: Diagnosis not present

## 2024-02-08 DIAGNOSIS — Z0000 Encounter for general adult medical examination without abnormal findings: Secondary | ICD-10-CM | POA: Diagnosis not present

## 2024-02-08 DIAGNOSIS — T466X5A Adverse effect of antihyperlipidemic and antiarteriosclerotic drugs, initial encounter: Secondary | ICD-10-CM | POA: Diagnosis not present

## 2024-02-08 DIAGNOSIS — I10 Essential (primary) hypertension: Secondary | ICD-10-CM | POA: Diagnosis not present

## 2024-02-08 DIAGNOSIS — I2510 Atherosclerotic heart disease of native coronary artery without angina pectoris: Secondary | ICD-10-CM | POA: Diagnosis not present

## 2024-02-08 DIAGNOSIS — E7800 Pure hypercholesterolemia, unspecified: Secondary | ICD-10-CM | POA: Diagnosis not present

## 2024-02-08 DIAGNOSIS — I700 Atherosclerosis of aorta: Secondary | ICD-10-CM | POA: Diagnosis not present

## 2024-03-10 ENCOUNTER — Encounter: Payer: Self-pay | Admitting: *Deleted

## 2024-03-10 NOTE — Progress Notes (Signed)
 LASARO PRIMM                                          MRN: [id number]   03/10/2024   The [facility] Quality Team Specialist reviewed this patient medical record for the purposes of chart review for care gap closure. The following were reviewed: abstraction for care gap closure-glycemic status assessment and kidney health evaluation for diabetes:eGFR  and uACR.    [facility] Quality Team
